# Patient Record
Sex: Male | Born: 1970 | Race: White | Hispanic: No | Marital: Married | Smoking: Never smoker
Health system: Southern US, Community
[De-identification: ages and names within clinical notes are randomized; demographics above are authoritative.]

## PROBLEM LIST (undated history)

## (undated) DIAGNOSIS — IMO0001 Reserved for inherently not codable concepts without codable children: Secondary | ICD-10-CM

## (undated) DIAGNOSIS — G4730 Sleep apnea, unspecified: Secondary | ICD-10-CM

## (undated) DIAGNOSIS — K219 Gastro-esophageal reflux disease without esophagitis: Secondary | ICD-10-CM

## (undated) HISTORY — PX: EYE SURGERY: SHX253

## (undated) HISTORY — PX: HERNIA REPAIR: SHX51

## (undated) HISTORY — PX: FRACTURE SURGERY: SHX138

## (undated) HISTORY — PX: ELBOW SURGERY: SHX618

## (undated) HISTORY — PX: OTHER SURGICAL HISTORY: SHX169

---

## 2001-09-30 ENCOUNTER — Ambulatory Visit (HOSPITAL_BASED_OUTPATIENT_CLINIC_OR_DEPARTMENT_OTHER): Admission: RE | Admit: 2001-09-30 | Discharge: 2001-09-30 | Payer: Self-pay | Admitting: Orthopedic Surgery

## 2008-08-23 ENCOUNTER — Ambulatory Visit (HOSPITAL_COMMUNITY): Admission: RE | Admit: 2008-08-23 | Discharge: 2008-08-23 | Payer: Self-pay | Admitting: General Surgery

## 2010-04-21 LAB — DIFFERENTIAL
Basophils Absolute: 0 10*3/uL (ref 0.0–0.1)
Basophils Relative: 0 % (ref 0–1)
Eosinophils Absolute: 0.1 10*3/uL (ref 0.0–0.7)
Eosinophils Relative: 2 % (ref 0–5)
Lymphocytes Relative: 20 % (ref 12–46)
Lymphs Abs: 1.6 10*3/uL (ref 0.7–4.0)
Monocytes Absolute: 0.9 10*3/uL (ref 0.1–1.0)
Monocytes Relative: 11 % (ref 3–12)
Neutro Abs: 5.3 10*3/uL (ref 1.7–7.7)
Neutrophils Relative %: 67 % (ref 43–77)

## 2010-04-21 LAB — URINALYSIS, ROUTINE W REFLEX MICROSCOPIC
Bilirubin Urine: NEGATIVE
Glucose, UA: NEGATIVE mg/dL
Hgb urine dipstick: NEGATIVE
Ketones, ur: NEGATIVE mg/dL
Nitrite: NEGATIVE
Protein, ur: NEGATIVE mg/dL
Specific Gravity, Urine: 1.024 (ref 1.005–1.030)
Urobilinogen, UA: 0.2 mg/dL (ref 0.0–1.0)
pH: 5 (ref 5.0–8.0)

## 2010-04-21 LAB — CBC
HCT: 44.7 % (ref 39.0–52.0)
Hemoglobin: 15.1 g/dL (ref 13.0–17.0)
MCHC: 33.7 g/dL (ref 30.0–36.0)
MCV: 93.1 fL (ref 78.0–100.0)
Platelets: 287 10*3/uL (ref 150–400)
RBC: 4.8 MIL/uL (ref 4.22–5.81)
RDW: 13 % (ref 11.5–15.5)
WBC: 7.9 10*3/uL (ref 4.0–10.5)

## 2010-05-29 NOTE — Op Note (Signed)
NAMETERRACE, FONTANILLA NO.:  [id number]   MEDICAL RECORD NO.:  [id number]          PATIENT TYPE:  AMB   LOCATION:  DAY                          FACILITY:  [facility]   PHYSICIAN:  Almond Lint, MD       DATE OF BIRTH:  Dec 13, 1970   DATE OF PROCEDURE:  08/23/2008  DATE OF DISCHARGE:                               OPERATIVE REPORT   PREOPERATIVE DIAGNOSES:  Right inguinal hernia and hemorrhoids.   POSTOPERATIVE DIAGNOSIS:  Right inguinal hernia and hemorrhoids.   PROCEDURE PERFORMED:  Right inguinal herniorrhaphy with mesh,  examination under anesthesia, with the internal hemorrhoid banding x2.   SURGEON:  Almond Lint, M.D.   ASSISTANT:  None.   ANESTHESIA:  General and local.   FINDINGS:  Indirect inguinal hernia and prolapsing internal hemorrhoids.   SPECIMEN:  None.   ESTIMATED BLOOD LOSS:  15 mL.   COMPLICATIONS:  None noted.   PROCEDURE:  Mr. Sobol was identified in the holding area and taken to the  operating room, where he was placed supine on the operating room table.  General endotracheal anesthesia was induced.  He was placed into  lithotomy stirrups and these were placed to the midline and as far  posterior as would ago.  His groin was clipped, prepped and draped in a  sterile fashion.  Time-out was performed according to the surgical  safety check list.  When all was correct we continued.  The anterior-  superior iliac spine was identified, as well as the pubis.  A 5-cm  incision was anesthetized and created with a #15 blade slightly superior  so the inguinal ligament.  Subcutaneous tissues were divided with the  Bovie cautery.  The external oblique fascia was identified and opened  with the scalpel.  Metzenbaum scissors were used to elevate the fascia  and then to extend the fascial incision to the external inguinal ring  inferomedially and then posterolaterally.  The Kittner was used to  dissect the external oblique from the underlying fascia.   Similarly the  external oblique was elevated with a hemostat and a Kittner was used to  clean out the inferior shelving edge.  The spermatic cord was then  elevated and a Penrose drain was passed around it.  He was noted to have  very prominent cremasteric fibers.  These were dissected free and the  hernia sac was identified and elevated with a hemostat.  This was  dissected completely from the spermatic cord and then opened up.  There  were no intra-abdominal contents in the hernia sac.   The hernia sac was closed under direct visualization with a 2-0 silk  pursestring suture.  This was then dunked back into the abdomen.  Care  was taken with the suture to make sure that the vas deferens or the  spermatic vessels were not incorporated into the pursestring.  The  ilioinguinal nerve was passing directly through the area to be sutured  and so this was cut.  The cremasteric fibers were stripped down further  off the cord posteriorly in order to  clean off the insertion of the cord  entering the internal ring.  A piece of 3 x 6 polypropylene mesh was cut  to the appropriate size and secured to the pubic tubercle with a 2-0  Prolene.  The inferior shelving was run with the mesh overlapping it a  bit.  This was run past the internal ring.  The tails were cut on the  mesh and then passed around the spermatic cord.  The superior shelving  edge was also run with a 2-0 Prolene.  A running suture was placed for  the first third of the incision and then interrupted 2-0 Prolenes were  used to make sure the mesh did not bunch.  This tail was also tucked  under the external oblique.  A 2-0 Prolene was used to secure the tails  to each other, making sure that this was not too tight.  The external  oblique was reapproximated with 2-0 Vicryl.  Scarpa fascia was  reapproximated with a running 3-0 Vicryl and then deep dermals were  placed with 3-0 Vicryl.  The skin was closed using 4-0 running Monocryl   subcuticular.  The skin was cleaned, dried, and dressed with Dermabond.   The patient was then placed into lithotomy position and the scrotum was  taped out of the way.  The perineum was prepped and draped in sterile  fashion.  The hemorrhoids were immediately seen to be prolapsing.  An  exam under anesthesia was performed digitally, as well as with the  anoscope.  There were no masses palpated.  The right anterior and left  posterior aspects were the locations of the 2 large prolapsing internal  hemorrhoids.  These were banded.  This banding was performed after  injection with Marcaine mixed was Wydase.  The anus was then dressed  with Gelfoam with dibucaine ointment.  The patient was placed into mesh  underwear with gauze dressing on the perineum.  The patient was awakened  from anesthesia and taken to the PACU in stable condition.      Almond Lint, MD  Electronically Signed     FB/MEDQ  D:  08/23/2008  T:  08/23/2008  Job:  [id number]

## 2010-06-01 NOTE — Op Note (Signed)
NAME:  John Pacheco, John Pacheco                             ACCOUNT NO.:  [id number]   MEDICAL RECORD NO.:  [id number]                   PATIENT TYPE:  AMB   LOCATION:  DSC                                  FACILITY:  [facility]   PHYSICIAN:  Harvie Junior, M.D.                DATE OF BIRTH:  Jun 11, 1970   DATE OF PROCEDURE:  09/30/2001  DATE OF DISCHARGE:                                 OPERATIVE REPORT   PREOPERATIVE DIAGNOSIS:  Ulnar nerve neuritis, left elbow.   POSTOPERATIVE DIAGNOSIS:  Ulnar nerve neuritis, left elbow.   PROCEDURE:  Left ulnar nerve decompression.   SURGEON:  Harvie Junior, M.D.   ASSISTANT:  Currie Paris. Thedore Mins.   ANESTHESIA:  General.   BRIEF HISTORY:  He is a 40 year old male with a long history of having  significant injury to his elbow where he began having ulnar nerve-type  symptoms.  He ultimately underwent EMG, which was normal.  He continued to  have complaints of compression and pain around the elbow, positive Phalen's  at the elbow, radiating pain down the little and ring finger.  We treated  him conservatively with night splinting, anti-inflammatory medications,  steroid injection.  None of this seemed to resolve his pain.  Ultimately  because of continued complaints of pain, a repeat EMG was done, which was  again normal.  We talked about treatment options, including observation  versus ulnar decompression.  Ultimately the patient wanted to undergo ulnar  decompression, and he was brought to the operating room for this procedure.   DESCRIPTION OF PROCEDURE:  The patient was brought to the operating room and  after adequate anesthesia was obtained with a general anesthetic, the  patient was placed supine upon the operating table.  The left arm was then  prepped and draped in the usual sterile fashion.  Following this, a small  curved incision was made over the elbow.  The subcutaneous tissue was taken  down to the level of the ulnar nerve.  The ulnar nerve  was clearly  identified and in a fascial band.  The fascia was opened.  The ulnar nerve  was clearly identified.  A Glorious Peach was used to make sure it was not adherent,  and the fascia was opened both proximally and distally.  Care was then taken  with a headlight and Sewell retractors to be able to evaluate the nerve  proximally.  It was decompressed approximately 10 cm proximal to the elbow  crease.  Care was then taken to come down to the elbow and distal to the  elbow, the fascia over the flexor carpi ulnaris was identified and divided.  The two heads of the flexor carpi ulnaris muscle were identified down to the  ulnar nerve.  The ulnar nerve was identified to 10 cm distal to the elbow,  and it was freed up over that entire  distance.  Irrigation was used on the  nerve at that point, and the nerve was tested with a Therapist, nutritional to make  sure that it was, in fact, loose on both sides over that distance of 20 cm.  At this point the wound was copiously irrigated and suctioned dry.  The skin  was closed with a combination of 3-0 Vicryl and 3-0 Maxon  running suture, Benzoin and Steri-Strips were applied, and a sterile  compressive dressing was applied as well as a long-arm plaster splint, and  the patient taken to recovery, where he was noted to be in satisfactory  condition.  Estimated blood loss for the procedure was none.                                               Harvie Junior, M.D.    Ranae Plumber  D:  09/30/2001  T:  10/01/2001  Job:  [id number]

## 2011-07-22 ENCOUNTER — Ambulatory Visit (INDEPENDENT_AMBULATORY_CARE_PROVIDER_SITE_OTHER): Payer: PRIVATE HEALTH INSURANCE | Admitting: Family Medicine

## 2011-07-22 VITALS — BP 110/66 | HR 63 | Temp 98.5°F | Resp 16 | Ht 67.25 in | Wt 135.8 lb

## 2011-07-22 DIAGNOSIS — F411 Generalized anxiety disorder: Secondary | ICD-10-CM

## 2011-07-22 DIAGNOSIS — F419 Anxiety disorder, unspecified: Secondary | ICD-10-CM

## 2011-07-22 MED ORDER — ALPRAZOLAM 0.5 MG PO TBDP: ORAL_TABLET | ORAL | Status: DC

## 2011-07-22 NOTE — Progress Notes (Signed)
[facility]:  Office Visit  Chief Complaint:  Chief Complaint  Patient presents with  . Medication Refill    need alprazolam    HPI: John Pacheco is a 41 y.o. male who complains of: Here for medication refill. Is in the midst of changing PCP and is out of Xanax. He normally sees Dr. Doristine Counter but is trying to get to a new PCP at [facility] office on [address] which is closer to his home. Appt is this Friday. He also sees Chief Operating Officer for therapy. He is trying to get new PCP to help him taper off Xanax but does not want to do it cold [place]. He was rx Xanax 3 years ago for situational stress but has been on it ever sicne and would like to get off of it and needs to know how to do it without having withdrawal sxs.   Past Medical History  Diagnosis Date  . Anxiety    Past Surgical History  Procedure Date  . Eye surgery   . Right leg fracture s/p repair   . Elbow surgery   . Hernia repair   . Feet surgery     flat feet   History   Social History  . Marital Status: Married    Spouse Name: N/A    Number of Children: N/A  . Years of Education: N/A   Social History Main Topics  . Smoking status: Never Smoker   . Smokeless tobacco: None  . Alcohol Use: None  . Drug Use: None  . Sexually Active: None   Other Topics Concern  . None   Social History Narrative  . None   Family History  Problem Relation Age of Onset  . Mental illness Mother    No Known Allergies Prior to Admission medications   Medication Sig Start Date End Date Taking? Authorizing Provider  ALPRAZolam Prudy Feeler) 0.5 MG tablet Take 0.5 mg by mouth at bedtime as needed.   Yes Historical Provider, MD  ibuprofen (ADVIL,MOTRIN) 100 MG tablet Take 100 mg by mouth every 6 (six) hours as needed.   Yes Historical Provider, MD     ROS: The patient denies fevers, chills, night sweats, unintentional weight loss, chest pain, palpitations, wheezing, dyspnea on exertion, nausea, vomiting, abdominal  pain, dysuria, hematuria, melena, numbness, weakness, or tingling.  All other systems have been reviewed and were otherwise negative with the exception of those mentioned in the HPI and as above.    PHYSICAL EXAM: Filed Vitals:   07/22/11 1720  BP: 110/66  Pulse: 63  Temp: 98.5 F (36.9 C)  Resp: 16   Filed Vitals:   07/22/11 1720  Height: 5' 7.25" (1.708 m)  Weight: 135 lb 12.8 oz (61.598 kg)   Body mass index is 21.11 kg/(m^2).  General: Alert, Mildly Anxious.  HEENT:  Normocephalic, atraumatic, oropharynx patent. EOMI. Cardiovascular:  Regular rate and rhythm, no rubs murmurs or gallops.  No Carotid bruits, radial pulse intact. No pedal edema.  Respiratory: Clear to auscultation bilaterally.  No wheezes, rales, or rhonchi.  No cyanosis, no use of accessory musculature GI: No organomegaly, abdomen is soft and non-tender, positive bowel sounds.  No masses. Skin: No rashes. Neurologic: Facial musculature symmetric. Psychiatric: Patient is appropriate throughout our interaction. Lymphatic: No cervical lymphadenopathy Musculoskeletal: Gait intact.   LABS: Results for orders placed during the hospital encounter of 08/23/08  URINALYSIS, ROUTINE W REFLEX MICROSCOPIC      Component Value Range   Color, Urine YELLOW  YELLOW   APPearance CLEAR  CLEAR   Specific Gravity, Urine 1.024  1.005 - 1.030   pH 5.0  5.0 - 8.0   Glucose, UA NEGATIVE  NEGATIVE mg/dL   Hgb urine dipstick NEGATIVE  NEGATIVE   Bilirubin Urine NEGATIVE  NEGATIVE   Ketones, ur NEGATIVE  NEGATIVE mg/dL   Protein, ur NEGATIVE  NEGATIVE mg/dL   Urobilinogen, UA 0.2  0.0 - 1.0 mg/dL   Nitrite NEGATIVE  NEGATIVE   Leukocytes, UA    NEGATIVE   Value: NEGATIVE MICROSCOPIC NOT DONE ON URINES WITH NEGATIVE PROTEIN, BLOOD, LEUKOCYTES, NITRITE, OR GLUCOSE <1000 mg/dL.  CBC      Component Value Range   WBC 7.9  4.0 - 10.5 K/uL   RBC 4.80  4.22 - 5.81 MIL/uL   Hemoglobin 15.1  13.0 - 17.0 g/dL   HCT 96.0  45.4 - 09.8  %   MCV 93.1  78.0 - 100.0 fL   MCHC 33.7  30.0 - 36.0 g/dL   RDW 11.9  14.7 - 82.9 %   Platelets 287  150 - 400 K/uL  DIFFERENTIAL      Component Value Range   Neutrophils Relative 67  43 - 77 %   Neutro Abs 5.3  1.7 - 7.7 K/uL   Lymphocytes Relative 20  12 - 46 %   Lymphs Abs 1.6  0.7 - 4.0 K/uL   Monocytes Relative 11  3 - 12 %   Monocytes Absolute 0.9  0.1 - 1.0 K/uL   Eosinophils Relative 2  0 - 5 %   Eosinophils Absolute 0.1  0.0 - 0.7 K/uL   Basophils Relative 0  0 - 1 %   Basophils Absolute 0.0  0.0 - 0.1 K/uL     EKG/XRAY:   Primary read interpreted by Dr. Conley Rolls at [facility].   ASSESSMENT/PLAN: Encounter Diagnosis  Name Primary?  Marland Kitchen Anxiety Yes   Gave rx for Xanax 0.5 mg #20. No refills. Patient knows that he will not get refills from our office.  He needs to establish care with his new PCP Dr. Belinda Block in order to get Xanax refilled.     Alicja Everitt PHUONG, DO 07/22/2011 6:12 PM

## 2012-05-22 ENCOUNTER — Emergency Department (HOSPITAL_BASED_OUTPATIENT_CLINIC_OR_DEPARTMENT_OTHER): Payer: Managed Care, Other (non HMO)

## 2012-05-22 ENCOUNTER — Emergency Department (HOSPITAL_BASED_OUTPATIENT_CLINIC_OR_DEPARTMENT_OTHER)
Admission: EM | Admit: 2012-05-22 | Discharge: 2012-05-22 | Disposition: A | Discharge status: Home / Self Care | Payer: Managed Care, Other (non HMO) | Attending: Emergency Medicine | Admitting: Emergency Medicine

## 2012-05-22 ENCOUNTER — Encounter (HOSPITAL_BASED_OUTPATIENT_CLINIC_OR_DEPARTMENT_OTHER): Payer: Self-pay | Admitting: *Deleted

## 2012-05-22 DIAGNOSIS — F411 Generalized anxiety disorder: Secondary | ICD-10-CM | POA: Insufficient documentation

## 2012-05-22 DIAGNOSIS — M25469 Effusion, unspecified knee: Secondary | ICD-10-CM | POA: Insufficient documentation

## 2012-05-22 DIAGNOSIS — Z8719 Personal history of other diseases of the digestive system: Secondary | ICD-10-CM | POA: Insufficient documentation

## 2012-05-22 DIAGNOSIS — M25462 Effusion, left knee: Secondary | ICD-10-CM

## 2012-05-22 HISTORY — DX: Reserved for inherently not codable concepts without codable children: IMO0001

## 2012-05-22 HISTORY — DX: Gastro-esophageal reflux disease without esophagitis: K21.9

## 2012-05-22 MED ORDER — IBUPROFEN 800 MG PO TABS: 800.0000 mg | ORAL_TABLET | Freq: Three times a day (TID) | ORAL | Status: DC

## 2012-05-22 NOTE — ED Provider Notes (Signed)
History     CSN: [id number]  Arrival date & time 05/22/12  4098   First MD Initiated Contact with Patient 05/22/12 1833      Chief Complaint  Patient presents with  . Knee Pain    left    (Consider location/radiation/quality/duration/timing/severity/associated sxs/prior treatment) Patient is a 42 y.o. male presenting with knee pain. The history is provided by the patient. No language interpreter was used.  Knee Pain Location:  Knee Time since incident:  7 days Knee location:  L knee Pain details:    Quality:  Aching   Radiates to:  Does not radiate   Severity:  Moderate   Onset quality:  Sudden   Duration:  7 days   Timing:  Constant Chronicity:  New Foreign body present:  No foreign bodies Pt ran a 5 k a week ago and has pain in his knee since.  Past Medical History  Diagnosis Date  . Anxiety   . Reflux     Past Surgical History  Procedure Laterality Date  . Eye surgery    . Right leg fracture s/p repair    . Elbow surgery    . Hernia repair    . Feet surgery      flat feet    Family History  Problem Relation Age of Onset  . Mental illness Mother     History  Substance Use Topics  . Smoking status: Never Smoker   . Smokeless tobacco: Never Used  . Alcohol Use: No      Review of Systems  Musculoskeletal: Positive for joint swelling.  All other systems reviewed and are negative.    Allergies  Review of patient's allergies indicates no known allergies.  Home Medications   Current Outpatient Rx  Name  Route  Sig  Dispense  Refill  . ALPRAZolam (NIRAVAM) 0.5 MG dissolvable tablet      Take 1/2 tab PO in AM with breakfast and 1/2 tab PO in afternoon PRN and 1 tab PO qhs PRN   20 tablet   0   . ibuprofen (ADVIL,MOTRIN) 100 MG tablet   Oral   Take 100 mg by mouth every 6 (six) hours as needed.           BP 108/67  Temp(Src) 98 F (36.7 C) (Oral)  Resp 20  Ht 5\' 7"  (1.702 m)  Wt 140 lb (63.504 kg)  BMI 21.92 kg/m2  SpO2  100%  Physical Exam  Vitals reviewed. Constitutional: He appears well-developed and well-nourished.  HENT:  Head: Normocephalic.  Musculoskeletal: He exhibits tenderness.  Tender left knee,  Trace effusion,  No instability  Neurological: He is alert.  Skin: Skin is warm.  Psychiatric: He has a normal mood and affect.    ED Course  Procedures (including critical care time)  Labs Reviewed - No data to display Dg Knee Complete 4 Views Left  05/22/2012  *RADIOLOGY REPORT*  Clinical Data: Left knee pain  LEFT KNEE - COMPLETE 4+ VIEW  Comparison: None.  Findings: Small suprapatellar effusion.  No fracture or dislocation.  Soft tissues are unremarkable.  No radiopaque foreign body.  IMPRESSION: Small suprapatellar effusion.  No acute osseous finding.   Original Report Authenticated By: Christiana Pellant, M.D.      1. Knee effusion, left       MDM  Knee imbolizer,  Ibuprofen,  Follow up with Dr. Pearletha Forge for recheck next week.        Lonia Skinner Patriot, PA-C 05/22/12 2024  Lonia Skinner Holt, PA-C 05/22/12 2026

## 2012-05-22 NOTE — ED Notes (Signed)
Patient states he ran a 5 K last Saturday and during the race, his left knee started to hurt.  Has continued to knee pain, which has progressively worsened.  No known injury.

## 2012-05-22 NOTE — ED Notes (Signed)
Patient transported to X-ray 

## 2012-05-23 NOTE — ED Provider Notes (Signed)
Medical screening examination/treatment/procedure(s) were performed by non-physician practitioner and as supervising physician I was immediately available for consultation/collaboration.    Gilda Crease, MD 05/23/12 1455

## 2012-05-27 ENCOUNTER — Encounter: Payer: Self-pay | Admitting: Family Medicine

## 2012-05-27 ENCOUNTER — Ambulatory Visit: Payer: Managed Care, Other (non HMO) | Admitting: Family Medicine

## 2012-05-27 ENCOUNTER — Ambulatory Visit (INDEPENDENT_AMBULATORY_CARE_PROVIDER_SITE_OTHER): Payer: Managed Care, Other (non HMO) | Admitting: Family Medicine

## 2012-05-27 VITALS — BP 118/76 | HR 80 | Ht 68.0 in | Wt 140.0 lb

## 2012-05-27 DIAGNOSIS — M25569 Pain in unspecified knee: Secondary | ICD-10-CM

## 2012-05-27 DIAGNOSIS — M25562 Pain in left knee: Secondary | ICD-10-CM

## 2012-05-27 NOTE — Patient Instructions (Addendum)
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 You do not have any arthritis or bony abnormalities on your x-rays. Start with quad and hamstring strengthening exercises - each 3 sets of 10 once a day. Avoid deep squats, deep lunges, stairmaster, leg press, running (for now), and plyometrics. Ice knee 15 minutes at a time 3-4 times a day. Aleve 2 tabs twice a day with food for pain and inflammation for 1 week then as needed. Stop using the immobilizer unless you absolutely need this. Knee braces usually aren't helpful for this condition. Work restrictions as noted. Follow up with me in 4-6 weeks for reevaluation.

## 2012-05-31 ENCOUNTER — Encounter: Payer: Self-pay | Admitting: Family Medicine

## 2012-05-31 NOTE — Progress Notes (Signed)
  Subjective:    Patient ID: John Pacheco, male    DOB: 08-19-1970, 42 y.o.   MRN: [id number]  PCP: Sedalia Muta  HPI 42 yo M here for left knee pain.  Patient reports he typically goes to the gym once or twice a week. He did a walk:jog program for a couple weeks and started developing medial left knee pain. This intensified over 3 days and also developed swelling. Tried ibuprofen, icing. No catching, locking, giving out. No prior issues with his knee.  Past Medical History  Diagnosis Date  . Anxiety   . Reflux     Current Outpatient Prescriptions on File Prior to Visit  Medication Sig Dispense Refill  . ALPRAZolam (NIRAVAM) 0.5 MG dissolvable tablet Take 1/2 tab PO in AM with breakfast and 1/2 tab PO in afternoon PRN and 1 tab PO qhs PRN  20 tablet  0   No current facility-administered medications on file prior to visit.    Past Surgical History  Procedure Laterality Date  . Eye surgery    . Right leg fracture s/p repair    . Elbow surgery    . Hernia repair    . Feet surgery      flat feet    No Known Allergies  History   Social History  . Marital Status: Married    Spouse Name: N/A    Number of Children: N/A  . Years of Education: N/A   Occupational History  . Not on file.   Social History Main Topics  . Smoking status: Never Smoker   . Smokeless tobacco: Never Used  . Alcohol Use: No  . Drug Use: No  . Sexually Active: Not on file   Other Topics Concern  . Not on file   Social History Narrative  . No narrative on file    Family History  Problem Relation Age of Onset  . Mental illness Mother     BP 118/76  Pulse 80  Ht 5\' 8"  (1.727 m)  Wt 140 lb (63.504 kg)  BMI 21.29 kg/m2  Review of Systems See HPI above.    Objective:   Physical Exam Gen: NAD  L knee: No gross deformity, ecchymoses.  Minimal effusion - confirmed on u/s. Medial joint line TTP.  No lateral joint line, post patellar facet TTP. FROM. Negative ant/post drawers.  Negative valgus/varus testing. Negative lachmanns. Mild pain medially with apleys and mcmurrays.  Negative sit home, thessalys. Negative apprehension. NV intact distally.  R knee: FROM without instability, pain.     Assessment & Plan:  1. Left knee pain - radiographs negative for arthritis.  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  Will start with conservative care - home exercise program, icing, nsaids regularly.  Discontinue immobilizer.  Avoid jumping, increasing running mileage, deep squats, lunges for now.  F/u in 4-6 weeks.  See letter for work restrictions.

## 2012-06-01 DIAGNOSIS — M25562 Pain in left knee: Secondary | ICD-10-CM | POA: Insufficient documentation

## 2012-06-01 NOTE — Assessment & Plan Note (Signed)
radiographs negative for arthritis.  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  Will start with conservative care - home exercise program, icing, nsaids regularly.  Discontinue immobilizer.  Avoid jumping, increasing running mileage, deep squats, lunges for now.  F/u in 4-6 weeks.  See letter for work restrictions.

## 2012-07-01 ENCOUNTER — Ambulatory Visit: Payer: Managed Care, Other (non HMO) | Admitting: Family Medicine

## 2012-07-08 ENCOUNTER — Encounter: Payer: Self-pay | Admitting: Family Medicine

## 2012-07-08 ENCOUNTER — Ambulatory Visit (INDEPENDENT_AMBULATORY_CARE_PROVIDER_SITE_OTHER): Payer: Self-pay | Admitting: Family Medicine

## 2012-07-08 VITALS — BP 106/69 | HR 84 | Ht 68.0 in | Wt 135.0 lb

## 2012-07-08 DIAGNOSIS — M25569 Pain in unspecified knee: Secondary | ICD-10-CM

## 2012-07-08 DIAGNOSIS — M25562 Pain in left knee: Secondary | ICD-10-CM

## 2012-07-09 ENCOUNTER — Encounter: Payer: Self-pay | Admitting: Family Medicine

## 2012-07-09 NOTE — Progress Notes (Signed)
  Subjective:    Patient ID: John Pacheco, male    DOB: 19-May-1970, 42 y.o.   MRN: [id number]  PCP: Sedalia Muta  HPI  42 yo M here for f/u left knee pain.  5/14: Patient reports he typically goes to the gym once or twice a week. He did a walk:jog program for a couple weeks and started developing medial left knee pain. This intensified over 3 days and also developed swelling. Tried ibuprofen, icing. No catching, locking, giving out. No prior issues with his knee.  6/25: Patient reports his left knee feels much better. Has some occasional tenderness medial aspect of this knee. No swelling. Has been icing and taking ibuprofen only as needed. Does home exercise program. No catching, locking, giving out.  Past Medical History  Diagnosis Date  . Anxiety   . Reflux     Current Outpatient Prescriptions on File Prior to Visit  Medication Sig Dispense Refill  . ALPRAZolam (NIRAVAM) 0.5 MG dissolvable tablet Take 1/2 tab PO in AM with breakfast and 1/2 tab PO in afternoon PRN and 1 tab PO qhs PRN  20 tablet  0   No current facility-administered medications on file prior to visit.    Past Surgical History  Procedure Laterality Date  . Eye surgery    . Right leg fracture s/p repair    . Elbow surgery    . Hernia repair    . Feet surgery      flat feet    No Known Allergies  History   Social History  . Marital Status: Married    Spouse Name: N/A    Number of Children: N/A  . Years of Education: N/A   Occupational History  . Not on file.   Social History Main Topics  . Smoking status: Never Smoker   . Smokeless tobacco: Never Used  . Alcohol Use: No  . Drug Use: No  . Sexually Active: Not on file   Other Topics Concern  . Not on file   Social History Narrative  . No narrative on file    Family History  Problem Relation Age of Onset  . Mental illness Mother     BP 106/69  Pulse 84  Ht 5\' 8"  (1.727 m)  Wt 135 lb (61.236 kg)  BMI 20.53  kg/m2  Review of Systems  See HPI above.    Objective:   Physical Exam  Gen: NAD  L knee: No gross deformity, ecchymoses, effusion. No medial, lateral joint line, post patellar facet TTP. FROM. Negative ant/post drawers. Negative valgus/varus testing. Negative lachmanns. No pain medially with apleys and mcmurrays. Negative apprehension. NV intact distally.    Assessment & Plan:  1. Left knee pain - radiographs negative for arthritis.  Much better since last visit with home exercise program, icing, nsaids.  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  Continue with HEP.  Return to full duty.  F/u as needed for any concerns.

## 2012-07-09 NOTE — Patient Instructions (Addendum)
Instructed to continue home exercise program for next 6 weeks and follow up as needed.

## 2012-07-09 NOTE — Assessment & Plan Note (Signed)
radiographs negative for arthritis.  Much better since last visit with home exercise program, icing, nsaids.  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  Continue with HEP.  Return to full duty.  F/u as needed for any concerns.

## 2013-03-13 ENCOUNTER — Ambulatory Visit (INDEPENDENT_AMBULATORY_CARE_PROVIDER_SITE_OTHER): Payer: Managed Care, Other (non HMO) | Admitting: Physician Assistant

## 2013-03-13 VITALS — BP 98/60 | HR 69 | Temp 98.2°F | Resp 16 | Ht 67.5 in | Wt 156.8 lb

## 2013-03-13 DIAGNOSIS — Z1159 Encounter for screening for other viral diseases: Secondary | ICD-10-CM

## 2013-03-13 DIAGNOSIS — R6889 Other general symptoms and signs: Secondary | ICD-10-CM

## 2013-03-13 DIAGNOSIS — R899 Unspecified abnormal finding in specimens from other organs, systems and tissues: Secondary | ICD-10-CM

## 2013-03-13 DIAGNOSIS — Z114 Encounter for screening for human immunodeficiency virus [HIV]: Secondary | ICD-10-CM

## 2013-03-13 NOTE — Progress Notes (Signed)
   Subjective:    Patient ID: John Pacheco, male    DOB: 05/10/1970, 43 y.o.   MRN: [id number]  HPI Has [facility] don[facility] for about a year and he tried to donate again - he got a letter from [facility] that he had a screening positive for HIV but in the letter it appeared that they also checked a western blot which was neg and a Elisa which was also neg.  He is concerned about these results and would like the test repeated. He was told that his results would ne reported and he is concerned about that.  Pt states that he feels fine.  Married for 5 years - both had neg STD testing after marriage - Never had sex with men. No h/o IV drug use.  Review of Systems     Objective:   Physical Exam  Vitals reviewed. Constitutional: He is oriented to person, place, and time. He appears well-developed and well-nourished.  HENT:  Head: Normocephalic and atraumatic.  Right Ear: External ear normal.  Left Ear: External ear normal.  Eyes: Conjunctivae are normal.  Pulmonary/Chest: Effort normal.  Neurological: He is alert and oriented to person, place, and time.  Skin: Skin is warm and dry.  Psychiatric: He has a normal mood and affect. His behavior is normal. Judgment and thought content normal.       Assessment & Plan:  Screening for HIV (human immunodeficiency virus) - Plan: HIV antibody, HIV-1 RNA ultraquant reflex to gentyp+  Abnormal laboratory test - Plan: HIV antibody, HIV-1 RNA ultraquant reflex to gentyp+  Due to his HIV + screening - we will recheck it today as well as do a viral load for more information.  We will call him with the results. He should not donate plasma or blood until we have more information.  Benny LennertSarah Weber PA-C 03/13/2013 3:04 PM

## 2013-03-13 NOTE — Patient Instructions (Signed)
We are going to test your blood with a screening test and a confirmatory test at the same time.  I will call you with your lab results when they are available.

## 2013-03-14 LAB — HIV ANTIBODY (ROUTINE TESTING W REFLEX): HIV: NONREACTIVE

## 2013-03-15 LAB — HIV-1 RNA ULTRAQUANT REFLEX TO GENTYP+
HIV 1 RNA Quant: <20 copies/mL (ref <20)
HIV-1 RNA Quant, Log: <1.3 {Log} (ref <1.30)

## 2013-03-17 ENCOUNTER — Telehealth: Payer: Self-pay | Admitting: Physician Assistant

## 2013-03-17 ENCOUNTER — Encounter: Payer: Self-pay | Admitting: Physician Assistant

## 2013-03-17 NOTE — Telephone Encounter (Signed)
I called regarding patients neg lab results.  We will mail a copy to the patient.

## 2013-06-02 ENCOUNTER — Encounter: Payer: Self-pay | Admitting: Family Medicine

## 2013-06-02 ENCOUNTER — Ambulatory Visit (INDEPENDENT_AMBULATORY_CARE_PROVIDER_SITE_OTHER): Payer: Managed Care, Other (non HMO) | Admitting: Family Medicine

## 2013-06-02 VITALS — BP 116/74 | HR 71 | Ht 67.0 in | Wt 145.0 lb

## 2013-06-02 DIAGNOSIS — M25562 Pain in left knee: Secondary | ICD-10-CM

## 2013-06-02 DIAGNOSIS — M25569 Pain in unspecified knee: Secondary | ICD-10-CM

## 2013-06-02 NOTE — Patient Instructions (Signed)
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 Ibuprofen or aleve as needed for pain. Do straight leg raises, straight leg raises with foot turned outwards, lateral side raises, decline squats 3 sets of 10 once a day. Patellar strap when up and walking around. Consider nitro patches if not improving as expected. Dr. Jari Pacheco active series insoles. Avoid barefoot walking, flat shoes as much as possible. Follow up with me in 1 month - if you want custom orthotics make sure you tell John Pacheco when you schedule the appointment.

## 2013-06-04 ENCOUNTER — Encounter: Payer: Self-pay | Admitting: Family Medicine

## 2013-06-04 NOTE — Assessment & Plan Note (Signed)
prior radiographs negative for arthritis.  Current issues more consistent with patellar/quad tendinopathy.  Will start with home exercise program which was reviewed today.  Patellar strap.  Exercise as tolerated.  Better arch supports, avoid barefoot walking.  Consider custom orthotics.

## 2013-06-04 NOTE — Progress Notes (Signed)
Patient ID: John Pacheco, male    DOB: October 17, 1970, 43 y.o.   MRN: [id number]  PCP: Sedalia Muta  HPI 43 yo M here for f/u left knee pain.  5/14: Patient reports he typically goes to the gym once or twice a week. He did a walk:jog program for a couple weeks and started developing medial left knee pain. This intensified over 3 days and also developed swelling. Tried ibuprofen, icing. No catching, locking, giving out. No prior issues with his knee.  07/08/12: Patient reports his left knee feels much better. Has some occasional tenderness medial aspect of this knee. No swelling. Has been icing and taking ibuprofen only as needed. Does home exercise program. No catching, locking, giving out.  06/02/13: Patient states he has been doing well but last few weeks pain has started to come back more anterior knee this time. No new injury or trauma. No swelling. No catching, locking, giving out. Taking occasional advil and using topical essential oils.  Past Medical History  Diagnosis Date  . Anxiety   . Reflux     Current Outpatient Prescriptions on File Prior to Visit  Medication Sig Dispense Refill  . hydrOXYzine (ATARAX/VISTARIL) 25 MG tablet Take 25 mg by mouth at bedtime.       No current facility-administered medications on file prior to visit.    Past Surgical History  Procedure Laterality Date  . Eye surgery    . Right leg fracture s/p repair    . Elbow surgery    . Hernia repair    . Feet surgery      flat feet    No Known Allergies  History   Social History  . Marital Status: Married    Spouse Name: N/A    Number of Children: N/A  . Years of Education: N/A   Occupational History  . Not on file.   Social History Main Topics  . Smoking status: Never Smoker   . Smokeless tobacco: Never Used  . Alcohol Use: No  . Drug Use: No  . Sexual Activity: Not on file   Other Topics Concern  . Not on file   Social History Narrative  . No narrative on file     Family History  Problem Relation Age of Onset  . Mental illness Mother     BP 116/74  Pulse 71  Ht 5\' 7"  (1.702 m)  Wt 145 lb (65.772 kg)  BMI 22.71 kg/m2  Review of Systems See HPI above.    Objective:   Physical Exam Gen: NAD  L knee: No gross deformity, ecchymoses, effusion. No medial, lateral joint line, post patellar facet TTP.  No other TTP but points to patellar, quad tendon areas as where he gets pain. FROM. Negative ant/post drawers. Negative valgus/varus testing. Negative lachmanns. No pain medially with apleys and mcmurrays. Negative apprehension. 5/5 hip abduction strength. NV intact distally. Overpronation bilaterally.    Assessment & Plan:  1. Left knee pain - prior radiographs negative for arthritis.  Current issues more consistent with patellar/quad tendinopathy.  Will start with home exercise program which was reviewed today.  Patellar strap.  Exercise as tolerated.  Better arch supports, avoid barefoot walking.  Consider custom orthotics.

## 2014-03-24 ENCOUNTER — Ambulatory Visit (INDEPENDENT_AMBULATORY_CARE_PROVIDER_SITE_OTHER): Payer: 59 | Admitting: Family Medicine

## 2014-03-24 VITALS — BP 118/74 | HR 75 | Temp 98.0°F | Resp 18 | Ht 69.0 in | Wt 169.0 lb

## 2014-03-24 DIAGNOSIS — N410 Acute prostatitis: Secondary | ICD-10-CM

## 2014-03-24 LAB — POCT URINALYSIS DIPSTICK
Bilirubin, UA: NEGATIVE
Glucose, UA: NEGATIVE
Ketones, UA: NEGATIVE
Leukocytes, UA: NEGATIVE
Nitrite, UA: NEGATIVE
Protein, UA: 30
Spec Grav, UA: >=1.03
Urobilinogen, UA: 0.2
pH, UA: 5.5

## 2014-03-24 LAB — POCT UA - MICROSCOPIC ONLY
Casts, Ur, LPF, POC: NEGATIVE
Crystals, Ur, HPF, POC: NEGATIVE
Epithelial cells, urine per micros: NEGATIVE
Mucus, UA: NEGATIVE
Yeast, UA: NEGATIVE

## 2014-03-24 MED ORDER — CIPROFLOXACIN HCL 500 MG PO TABS: 500.0000 mg | ORAL_TABLET | Freq: Two times a day (BID) | ORAL | Status: AC

## 2014-03-24 NOTE — Patient Instructions (Signed)
Take antibiotic twice a day for 2 weeks. Sitting in a bath with warm water can help relieve pain. Otherwise, alternating ibuprofen and tylenol can also help pain. Return to see me in two weeks for recheck.  Prostatitis The prostate gland is about the size and shape of a walnut. It is located just below your bladder. It produces one of the components of semen, which is made up of sperm and the fluids that help nourish and transport it out from the testicles. Prostatitis is inflammation of the prostate gland.  There are four types of prostatitis:  Acute bacterial prostatitis. This is the least common type of prostatitis. It starts quickly and usually is associated with a bladder infection, high fever, and shaking chills. It can occur at any age.  Chronic bacterial prostatitis. This is a persistent bacterial infection in the prostate. It usually develops from repeated acute bacterial prostatitis or acute bacterial prostatitis that was not properly treated. It can occur in men of any age but is most common in middle-aged men whose prostate has begun to enlarge. The symptoms are not as severe as those in acute bacterial prostatitis. Discomfort in the part of your body that is in front of your rectum and below your scrotum (perineum), lower abdomen, or in the head of your penis (glans) may represent your primary discomfort.  Chronic prostatitis (nonbacterial). This is the most common type of prostatitis. It is inflammation of the prostate gland that is not caused by a bacterial infection. The cause is unknown and may be associated with a viral infection or autoimmune disorder.  Prostatodynia (pelvic floor disorder). This is associated with increased muscular tone in the pelvis surrounding the prostate. CAUSES The causes of bacterial prostatitis are bacterial infection. The causes of the other types of prostatitis are unknown.  SYMPTOMS  Symptoms can vary depending upon the type of prostatitis that  exists. There can also be overlap in symptoms. Possible symptoms for each type of prostatitis are listed below. Acute Bacterial Prostatitis  Painful urination.  Fever or chills.  Muscle or joint pains.  Low back pain.  Low abdominal pain.  Inability to empty bladder completely. Chronic Bacterial Prostatitis, Chronic Nonbacterial Prostatitis, and Prostatodynia  Sudden urge to urinate.  Frequent urination.  Difficulty starting urine stream.  Weak urine stream.  Discharge from the urethra.  Dribbling after urination.  Rectal pain.  Pain in the testicles, penis, or tip of the penis.  Pain in the perineum.  Problems with sexual function.  Painful ejaculation.  Bloody semen. DIAGNOSIS  In order to diagnose prostatitis, your health care provider will ask about your symptoms. One or more urine samples will be taken and tested (urinalysis). If the urinalysis result is negative for bacteria, your health care provider may use a finger to feel your prostate (digital rectal exam). This exam helps your health care provider determine if your prostate is swollen and tender. It will also produce a specimen of semen that can be analyzed. TREATMENT  Treatment for prostatitis depends on the cause. If a bacterial infection is the cause, it can be treated with antibiotic medicine. In cases of chronic bacterial prostatitis, the use of antibiotics for up to 1 month or 6 weeks may be necessary. Your health care provider may instruct you to take sitz baths to help relieve pain. A sitz bath is a bath of hot water in which your hips and buttocks are under water. This relaxes the pelvic floor muscles and often helps to relieve the  pressure on your prostate. HOME CARE INSTRUCTIONS   Take all medicines as directed by your health care provider.  Take sitz baths as directed by your health care provider. SEEK MEDICAL CARE IF:   Your symptoms get worse, not better.  You have a fever. SEEK IMMEDIATE  MEDICAL CARE IF:   You have chills.  You feel nauseous or vomit.  You feel lightheaded or faint.  You are unable to urinate.  You have blood or blood clots in your urine. MAKE SURE YOU:  Understand these instructions.  Will watch your condition.  Will get help right away if you are not doing well or get worse. Document Released: 12/29/1999 Document Revised: 01/05/2013 Document Reviewed: 07/20/2012 [facility] Patient Information 2015 [place], [place]. This information is not intended to replace advice given to you by your health care provider. Make sure you discuss any questions you have with your health care provider.

## 2014-03-24 NOTE — Progress Notes (Signed)
Subjective:    Patient ID: John Pacheco, male    DOB: 10/01/1970, 44 y.o.   MRN: [id number]  HPI  This is a 44 year old male who is presenting with sharp rectal pain and pressure x 12 hours. States he feels like he needs to have a BM but doesn't. Also is having rectal pain with urination. This has never happened to him before. States he had problems with hemorrhoids 5-6 years ago but this pain feels more internal. He is not constipated. He has bowel movements every day to every other day. Past few days has had formed stool with a green color. He denies blood in stool, N/V/D, anal discharge, fever or chills. He is sexually active with his wife. He has a 59 week old daughter at home and is stressed out with this.   Review of Systems  Constitutional: Negative for fever and chills.  Gastrointestinal: Positive for rectal pain. Negative for nausea, vomiting, abdominal pain, diarrhea and blood in stool.  Genitourinary: Positive for difficulty urinating. Negative for dysuria, discharge, penile pain and testicular pain.  Musculoskeletal: Negative for back pain.  Skin: Negative for rash.    Patient Active Problem List   Diagnosis Date Noted  . Left knee pain 06/01/2012   Prior to Admission medications   Medication Sig Start Date End Date Taking? Authorizing Provider  fenofibrate 160 MG tablet Take 160 mg by mouth daily.   Yes Historical Provider, MD  hydrOXYzine (ATARAX/VISTARIL) 25 MG tablet Take 25 mg by mouth at bedtime.   Yes Historical Provider, MD  omeprazole (PRILOSEC) 40 MG capsule Take 40 mg by mouth daily.   Yes Historical Provider, MD  Vitamin D, Ergocalciferol, (DRISDOL) 50000 UNITS CAPS capsule Take 50,000 Units by mouth every 7 (seven) days.   Yes Historical Provider, MD   No Known Allergies  Patient's social and family history were reviewed.     Objective:   Physical Exam  Constitutional: He is oriented to person, place, and time. He appears well-developed and well-nourished.  No distress.  HENT:  Head: Normocephalic and atraumatic.  Right Ear: Hearing normal.  Left Ear: Hearing normal.  Nose: Nose normal.  Eyes: Conjunctivae and lids are normal. Right eye exhibits no discharge. Left eye exhibits no discharge. No scleral icterus.  Cardiovascular: Normal rate, regular rhythm, normal heart sounds, intact distal pulses and normal pulses.   No murmur heard. Pulmonary/Chest: Effort normal and breath sounds normal. No respiratory distress. He has no wheezes. He has no rhonchi. He has no rales.  Abdominal: Soft. Normal appearance. There is no tenderness. There is no CVA tenderness.  Genitourinary: Rectal exam shows no external hemorrhoid and no internal hemorrhoid. Prostate is tender.  Anal skin tag  Musculoskeletal: Normal range of motion.  Neurological: He is alert and oriented to person, place, and time.  Skin: Skin is warm, dry and intact. No lesion and no rash noted.  Psychiatric: He has a normal mood and affect. His speech is normal and behavior is normal. Thought content normal.   BP 118/74 mmHg  Pulse 75  Temp(Src) 98 F (36.7 C) (Oral)  Resp 18  Ht  (1.753 m)  Wt 169 lb (76.658 kg)  BMI 24.95 kg/m2  SpO2 96%  Results for orders placed or performed in visit on 03/24/14  POCT UA - Microscopic Only  Result Value Ref Range   WBC, Ur, HPF, POC 0-2    RBC, urine, microscopic 0-1    Bacteria, U Microscopic 1+  Mucus, UA neg    Epithelial cells, urine per micros neg    Crystals, Ur, HPF, POC neg    Casts, Ur, LPF, POC neg    Yeast, UA neg    Sperm    POCT urinalysis dipstick  Result Value Ref Range   Color, UA yellow    Clarity, UA cloudy    Glucose, UA neg    Bilirubin, UA neg    Ketones, UA neg    Spec Grav, UA >=1.030    Blood, UA trace-intact    pH, UA 5.5    Protein, UA 30    Urobilinogen, UA 0.2    Nitrite, UA neg    Leukocytes, UA Negative        Assessment & Plan:  1. Acute prostatitis PSA pending. UA with protein,  otherwise negative. Pt likely has acute bacterial prostatitis. Will treat with cipro 500 mg BID x 2 weeks. He will return in 2 weeks for recheck. - POCT UA - Microscopic Only - POCT urinalysis dipstick - PSA - ciprofloxacin (CIPRO) 500 MG tablet; Take 1 tablet (500 mg total) by mouth 2 (two) times daily.  Dispense: 28 tablet; Refill: 0   Roswell MinersNicole V. Dyke BrackettBush, PA-C, M[facility]  03/24/2014

## 2014-03-25 LAB — PSA: PSA: 1.54 ng/mL (ref <=4.00)

## 2014-03-25 NOTE — Progress Notes (Signed)
Patient discussed with Ms. Bush. Agree with assessment and plan of care per her note.   

## 2014-03-28 ENCOUNTER — Telehealth: Payer: Self-pay | Admitting: Physician Assistant

## 2014-03-28 NOTE — Telephone Encounter (Signed)
Called pt and left voicemail telling him the lab tests were normal. Asked him to call back and let the staff know how he was doing.

## 2014-07-12 IMAGING — CR DG KNEE COMPLETE 4+V*L*
4 series · 4 of 4 positions shown · non-contrast
Comparison: None.

CLINICAL DATA: Left knee pain

LEFT KNEE - COMPLETE 4+ VIEW

[t knee ap left]
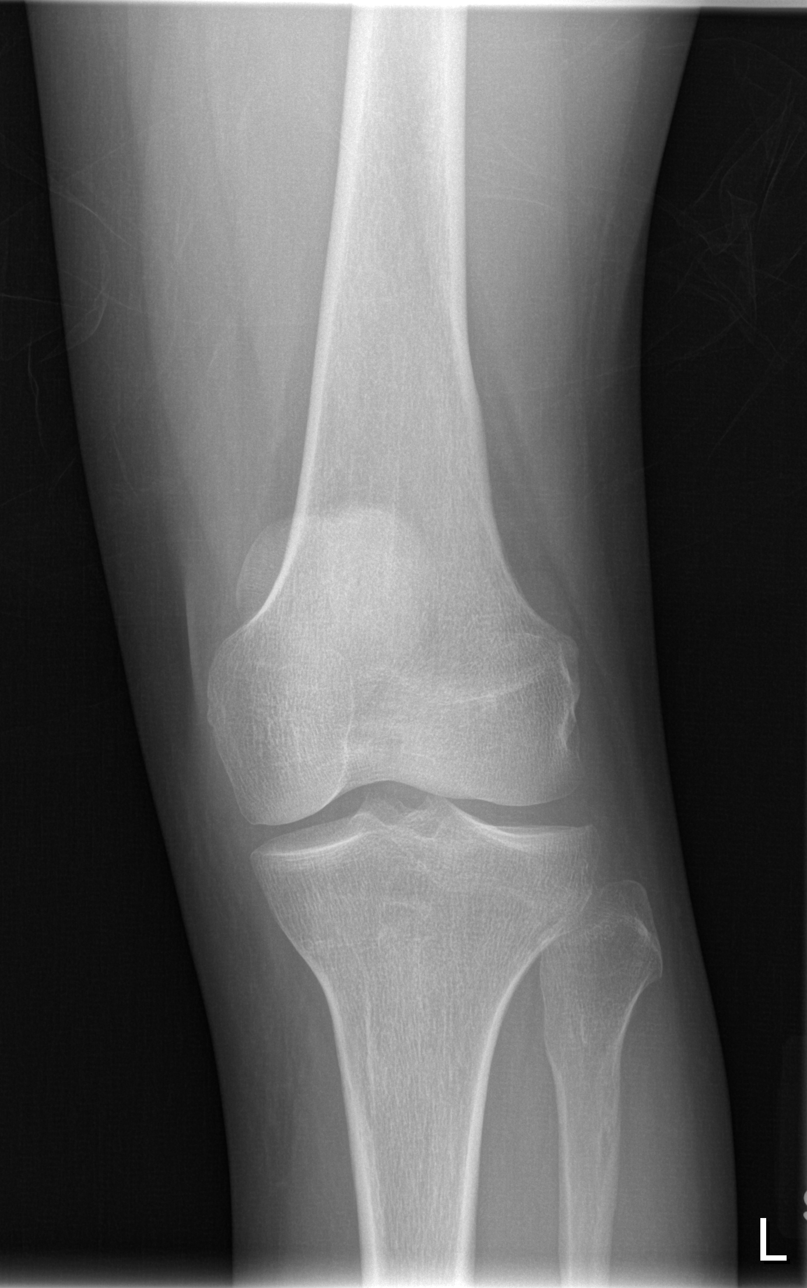

[t knee oblique left (1 of 2)]
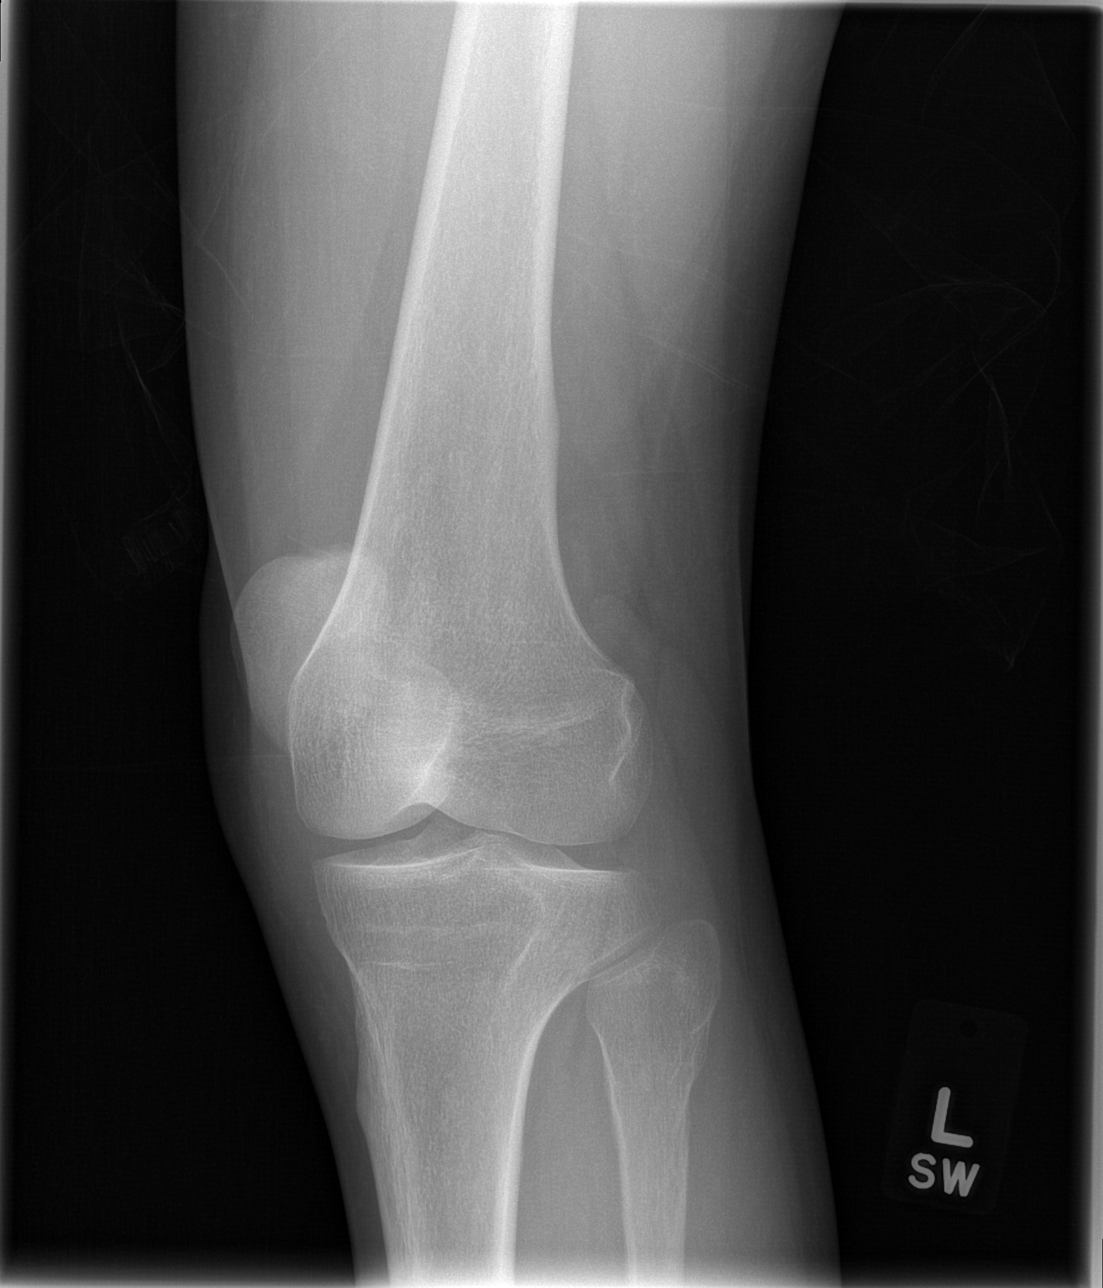

[t knee oblique left (2 of 2)]
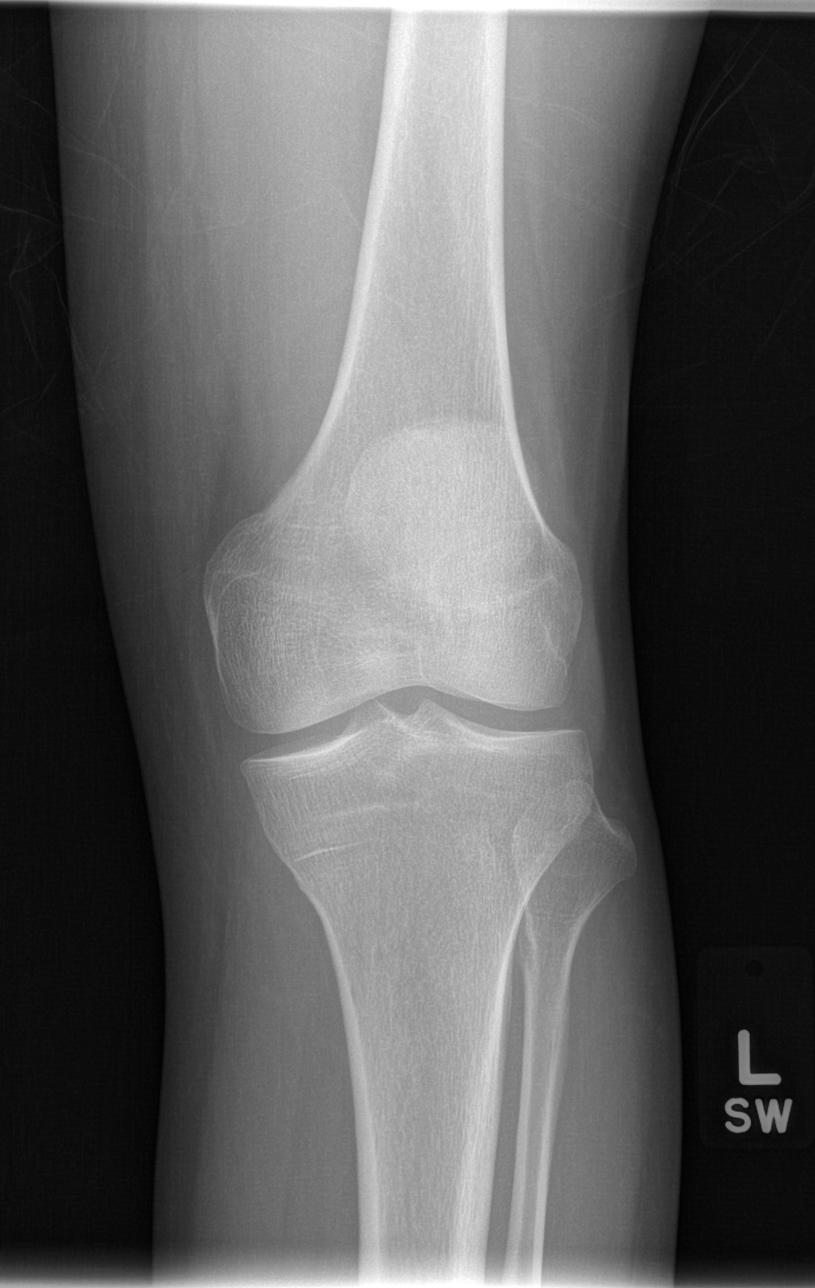

[t knee lat left]
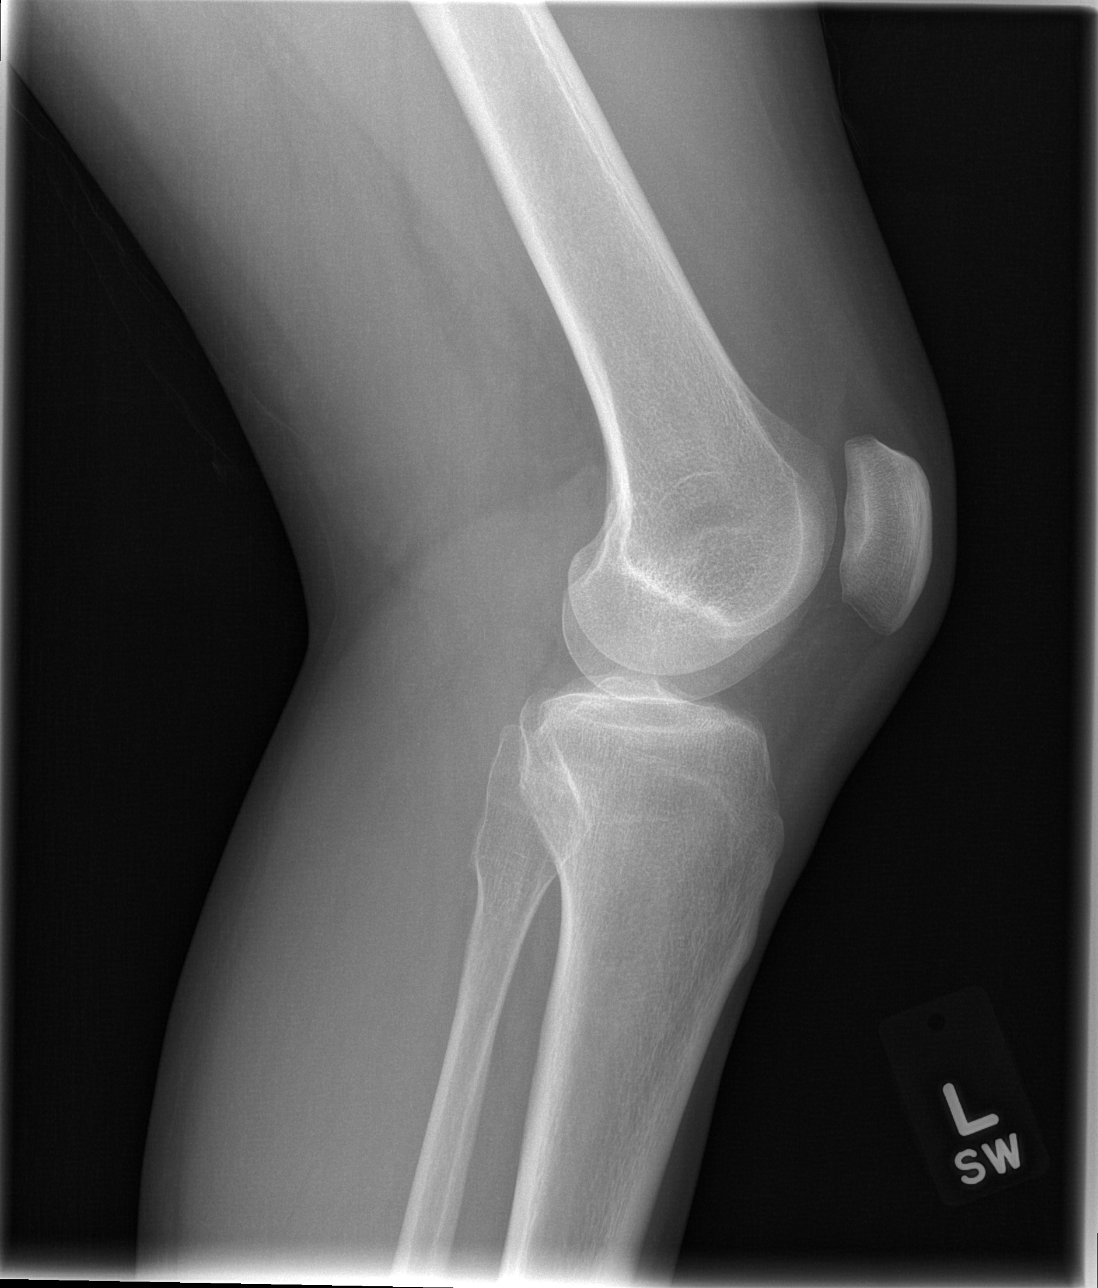

[4 of 4 positions shown; findings below may reference images not displayed]

FINDINGS: Small suprapatellar effusion.  No fracture or
dislocation.  Soft tissues are unremarkable.  No radiopaque foreign
body.
IMPRESSION: Small suprapatellar effusion.  No acute osseous finding.

## 2015-12-12 ENCOUNTER — Emergency Department (HOSPITAL_COMMUNITY)
Admission: EM | Admit: 2015-12-12 | Discharge: 2015-12-12 | Disposition: A | Discharge status: Home / Self Care | Payer: Medicaid Other | Attending: Emergency Medicine | Admitting: Emergency Medicine

## 2015-12-12 ENCOUNTER — Emergency Department (HOSPITAL_COMMUNITY): Payer: Medicaid Other

## 2015-12-12 ENCOUNTER — Encounter (HOSPITAL_COMMUNITY): Payer: Self-pay | Admitting: *Deleted

## 2015-12-12 DIAGNOSIS — M549 Dorsalgia, unspecified: Secondary | ICD-10-CM | POA: Diagnosis not present

## 2015-12-12 DIAGNOSIS — R0789 Other chest pain: Secondary | ICD-10-CM | POA: Diagnosis not present

## 2015-12-12 DIAGNOSIS — R079 Chest pain, unspecified: Secondary | ICD-10-CM | POA: Diagnosis present

## 2015-12-12 LAB — BASIC METABOLIC PANEL
Anion gap: 6 (ref 5–15)
BUN: 11 mg/dL (ref 6–20)
CO2: 27 mmol/L (ref 22–32)
Calcium: 9.8 mg/dL (ref 8.9–10.3)
Chloride: 107 mmol/L (ref 101–111)
Creatinine, Ser: 1.15 mg/dL (ref 0.61–1.24)
GFR calc Af Amer: >60 mL/min (ref >60)
GFR calc non Af Amer: >60 mL/min (ref >60)
Glucose, Bld: 108 mg/dL — ABNORMAL HIGH (ref 65–99)
Potassium: 4.3 mmol/L (ref 3.5–5.1)
Sodium: 140 mmol/L (ref 135–145)

## 2015-12-12 LAB — CBC
HCT: 46.6 % (ref 39.0–52.0)
Hemoglobin: 16.3 g/dL (ref 13.0–17.0)
MCH: 31.1 pg (ref 26.0–34.0)
MCHC: 35 g/dL (ref 30.0–36.0)
MCV: 88.9 fL (ref 78.0–100.0)
Platelets: 306 10*3/uL (ref 150–400)
RBC: 5.24 MIL/uL (ref 4.22–5.81)
RDW: 13.5 % (ref 11.5–15.5)
WBC: 8.6 10*3/uL (ref 4.0–10.5)

## 2015-12-12 LAB — I-STAT TROPONIN, ED: Troponin i, poc: 0.02 ng/mL (ref 0.00–0.08)

## 2015-12-12 MED ORDER — METHOCARBAMOL 500 MG PO TABS: 500.0000 mg | ORAL_TABLET | Freq: Four times a day (QID) | ORAL | 0 refills | Status: DC | PRN

## 2015-12-12 MED ORDER — KETOROLAC TROMETHAMINE 60 MG/2ML IM SOLN
60.0000 mg | Freq: Once | INTRAMUSCULAR | Status: AC
  Administered 2015-12-12: 60 mg via INTRAMUSCULAR
  Filled 2015-12-12: qty 2

## 2015-12-12 NOTE — Discharge Instructions (Signed)
Read the information below.  Use the prescribed medication as directed.  Please discuss all new medications with your pharmacist.  You may return to the Emergency Department at any time for worsening condition or any new symptoms that concern you.   ° °You have been diagnosed by your caregiver as having chest wall pain. °SEEK IMMEDIATE MEDICAL ATTENTION IF: °You develop a fever.  °Your chest pains become severe or intolerable.  °You develop new, unexplained symptoms (problems).  °You develop shortness of breath, nausea, vomiting, sweating or feel light headed.  °You develop a new cough or you cough up blood. °

## 2015-12-12 NOTE — ED Provider Notes (Signed)
[facility]-EMERGENCY DEPT Provider Note   CSN: [id number] Arrival date & time: 12/12/15  1503     History   Chief Complaint Chief Complaint  Patient presents with  . Chest Pain  . Back Pain    HPI John Pacheco is a 45 y.o. male.  HPI   Patient sent from [facility] for left chest and left back pain with EKG changes identified, sent for further evaluation.  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  Afterwards he developed persistent aching and soreness in this area.  The pain has been constant since yesterday, is worse with certain movements.  Denies any fevers, recent URI, cough, SOB.  Denies recent immobilization, leg swelling, history of blood clots.  Denies family hx early CAD.     Past Medical History:  Diagnosis Date  . Anxiety   . Reflux     Patient Active Problem List   Diagnosis Date Noted  . Left knee pain 06/01/2012    Past Surgical History:  Procedure Laterality Date  . ELBOW SURGERY    . EYE SURGERY    . feet surgery     flat feet  . HERNIA REPAIR    . right leg fracture s/p repair         Home Medications    Prior to Admission medications   Medication Sig Start Date End Date Taking? Authorizing Provider  cholecalciferol (VITAMIN D) 1000 units tablet Take 2,000 Units by mouth daily.   Yes Historical Provider, MD  fenofibrate 160 MG tablet Take 160 mg by mouth daily.   Yes Historical Provider, MD  hydrOXYzine (ATARAX/VISTARIL) 25 MG tablet Take 25 mg by mouth at bedtime.   Yes Historical Provider, MD  ibuprofen (ADVIL,MOTRIN) 200 MG tablet Take 400 mg by mouth every 6 (six) hours as needed for headache (or pain).   Yes Historical Provider, MD  omeprazole (PRILOSEC) 40 MG capsule Take 40 mg by mouth daily.   Yes Historical Provider, MD  Propylene Glycol (SYSTANE BALANCE) 0.6 % SOLN Place 1-2 drops into both eyes daily as needed (for  itching).   Yes Historical Provider, MD  methocarbamol (ROBAXIN) 500 MG tablet Take 1-2 tablets (500-1,000 mg total) by mouth every 6 (six) hours as needed (pain). 12/12/15   Trixie DredgeEmily Hanford Lust, PA-C    Family History Family History  Problem Relation Age of Onset  . Mental illness Mother   . Heart disease Mother   . Hyperlipidemia Mother   . Heart disease Father   . Hyperlipidemia Sister     Social History Social History  Substance Use Topics  . Smoking status: Never Smoker  . Smokeless tobacco: Never Used  . Alcohol use No     Allergies   Patient has no known allergies.   Review of Systems Review of Systems   Physical Exam Updated Vital Signs BP 125/89   Pulse 77   Temp 97.6 F (36.4 C) (Oral)   Resp 20   SpO2 98%   Physical Exam  Constitutional: He appears well-developed and well-nourished. No distress.  HENT:  Head: Normocephalic and atraumatic.  Neck: Neck supple.  Cardiovascular: Normal rate and regular rhythm.   Pulmonary/Chest: Effort normal and breath sounds normal. No respiratory distress. He has no wheezes. He has no rales.     He exhibits tenderness.    Abdominal: Soft. He exhibits no distension  and no mass. There is no tenderness. There is no rebound and no guarding.  Neurological: He is alert. He exhibits normal muscle tone.  Skin: He is not diaphoretic.  Nursing note and vitals reviewed.    ED Treatments / Results  Labs (all labs ordered are listed, but only abnormal results are displayed) Labs Reviewed  BASIC METABOLIC PANEL - Abnormal; Notable for the following:       Result Value   Glucose, Bld 108 (*)    All other components within normal limits  CBC  I-STAT TROPOININ, ED    EKG  EKG Interpretation  Date/Time:  Tuesday December 12 2015 15:13:22 EST Ventricular Rate:  76 PR Interval:  150 QRS Duration: 88 QT Interval:  372 QTC Calculation: 418 R Axis:   72 Text Interpretation:  Normal sinus rhythm Normal ECG Confirmed by  Rubin PayorPICKERING  MD, NATHAN [phone]([id number]) on 12/12/2015 6:51:32 PM       Radiology Dg Chest 2 View  Result Date: 12/12/2015 CLINICAL DATA:  Chest pain. EXAM: CHEST  2 VIEW COMPARISON:  None. FINDINGS: Normal heart size and mediastinal contours. Apparent ovoid density overlapping the lower trachea in the lateral projection is likely hilar vessels. No infiltrate or edema. No effusion or pneumothorax. No acute osseous findings. IMPRESSION: No active cardiopulmonary disease. Electronically Signed   By: Marnee SpringJonathon  Watts M.D.   On: 12/12/2015 15:32    Procedures Procedures (including critical care time)  Medications Ordered in ED Medications  ketorolac (TORADOL) injection 60 mg (not administered)     Initial Impression / Assessment and Plan / ED Course  I have reviewed the triage vital signs and the nursing notes.  Pertinent labs & imaging results that were available during my care of the patient were reviewed by me and considered in my medical decision making (see chart for details).  Clinical Course    Afebrile, nontoxic patient with left chest and left back pain that is reproducible with palpation and worse with movement.  Sent from urgent care for what they thought were concerning EKG changes.  Reviewed EKG from urgent care with Dr Rubin PayorPickering, who also saw the patient.  EKG is nonischemic, perhaps slight depression of PR interval rather than urgent care's concern for ST elevation.  Pain has been constant since yesterday.  Troponin negative.  CXR negative.  HEART score is 1 for known HLD. Toradol given in ED. Pt reassured.  D/C home with robaxin.  PCP resources for follow up.  Discussed result, findings, treatment, and follow up  with patient.  Pt given return precautions.  Pt verbalizes understanding and agrees with plan.         Final Clinical Impressions(s) / ED Diagnoses   Final diagnoses:  Chest wall pain    New Prescriptions New Prescriptions   METHOCARBAMOL (ROBAXIN) 500 MG TABLET     Take 1-2 tablets (500-1,000 mg total) by mouth every 6 (six) hours as needed (pain).     Trixie Dredgemily Annsley Akkerman, PA-C 12/12/15 1914    Benjiman CoreNathan Pickering, MD 12/12/15 [phone]

## 2015-12-12 NOTE — ED Triage Notes (Signed)
Pt reports onset of left side chest pain last night, radiates around his side and into his back. Denies sob. ekg done at triage. Pt was sent here from [facility].

## 2016-08-26 DIAGNOSIS — F411 Generalized anxiety disorder: Secondary | ICD-10-CM | POA: Diagnosis not present

## 2016-09-02 DIAGNOSIS — E784 Other hyperlipidemia: Secondary | ICD-10-CM | POA: Diagnosis not present

## 2016-09-02 DIAGNOSIS — Z125 Encounter for screening for malignant neoplasm of prostate: Secondary | ICD-10-CM | POA: Diagnosis not present

## 2016-09-02 DIAGNOSIS — Z0000 Encounter for general adult medical examination without abnormal findings: Secondary | ICD-10-CM | POA: Diagnosis not present

## 2016-09-02 DIAGNOSIS — R1013 Epigastric pain: Secondary | ICD-10-CM | POA: Diagnosis not present

## 2016-09-23 DIAGNOSIS — Z0000 Encounter for general adult medical examination without abnormal findings: Secondary | ICD-10-CM | POA: Diagnosis not present

## 2016-09-23 DIAGNOSIS — E784 Other hyperlipidemia: Secondary | ICD-10-CM | POA: Diagnosis not present

## 2016-09-23 DIAGNOSIS — Z125 Encounter for screening for malignant neoplasm of prostate: Secondary | ICD-10-CM | POA: Diagnosis not present

## 2016-10-02 ENCOUNTER — Ambulatory Visit (INDEPENDENT_AMBULATORY_CARE_PROVIDER_SITE_OTHER): Payer: BLUE CROSS/BLUE SHIELD | Admitting: Urgent Care

## 2016-10-02 ENCOUNTER — Encounter: Payer: Self-pay | Admitting: Urgent Care

## 2016-10-02 VITALS — BP 125/83 | HR 60 | Temp 98.0°F | Resp 18 | Ht 69.0 in | Wt 178.0 lb

## 2016-10-02 DIAGNOSIS — S0033XA Contusion of nose, initial encounter: Secondary | ICD-10-CM | POA: Diagnosis not present

## 2016-10-02 DIAGNOSIS — S0031XA Abrasion of nose, initial encounter: Secondary | ICD-10-CM | POA: Diagnosis not present

## 2016-10-02 DIAGNOSIS — R519 Headache, unspecified: Secondary | ICD-10-CM

## 2016-10-02 DIAGNOSIS — R51 Headache: Secondary | ICD-10-CM | POA: Diagnosis not present
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
## 2016-10-02 NOTE — Patient Instructions (Addendum)
You may take  Tylenol with ibuprofen 400-600mg  every 6 hours for pain and inflammation.    Contusion A contusion is a deep bruise. Contusions are the result of a blunt injury to tissues and muscle fibers under the skin. The injury causes bleeding under the skin. The skin overlying the contusion may turn blue, purple, or yellow. Minor injuries will give you a painless contusion, but more severe contusions may stay painful and swollen for a few weeks. What are the causes? This condition is usually caused by a blow, trauma, or direct force to an area of the body. What are the signs or symptoms? Symptoms of this condition include:  Swelling of the injured area.  Pain and tenderness in the injured area.  Discoloration. The area may have redness and then turn blue, purple, or yellow.  How is this diagnosed? This condition is diagnosed based on a physical exam and medical history. An X-ray, CT scan, or MRI may be needed to determine if there are any associated injuries, such as broken bones (fractures). How is this treated? Specific treatment for this condition depends on what area of the body was injured. In general, the best treatment for a contusion is resting, icing, applying pressure to (compression), and elevating the injured area. This is often called the RICE strategy. Over-the-counter anti-inflammatory medicines may also be recommended for pain control. Follow these instructions at home:  Rest the injured area.  If directed, apply ice to the injured area: ? Put ice in a plastic bag. ? Place a towel between your skin and the bag. ? Leave the ice on for 20 minutes, 2-3 times per day.  If directed, apply light compression to the injured area using an elastic bandage. Make sure the bandage is not wrapped too tightly. Remove and reapply the bandage as directed by your health care provider.  If possible, raise (elevate) the injured area above the level of your heart while you are  sitting or lying down.  Take over-the-counter and prescription medicines only as told by your health care provider. Contact a health care provider if:  Your symptoms do not improve after several days of treatment.  Your symptoms get worse.  You have difficulty moving the injured area. Get help right away if:  You have severe pain.  You have numbness in a hand or foot.  Your hand or foot turns pale or cold. This information is not intended to replace advice given to you by your health care provider. Make sure you discuss any questions you have with your health care provider. Document Released: 10/10/2004 Document Revised: 05/11/2015 Document Reviewed: 05/18/2014 Elsevier Interactive Patient Education  2017 [employer].       IF you received an x-ray today, you will receive an invoice from [facility]. Please contact [facility] at [phone] with questions or concerns regarding your invoice.   IF you received labwork today, you will receive an invoice from [facility]. Please contact [facility] at [phone] with questions or concerns regarding your invoice.   Our billing staff will not be able to assist you with questions regarding bills from these companies.  You will be contacted with the lab results as soon as they are available. The fastest way to get your results is to activate your My Chart account. Instructions are located on the last page of this paperwork. If you have not heard from [place] regarding the results in 2 weeks, please contact this office.

## 2016-10-07 DIAGNOSIS — G4709 Other insomnia: Secondary | ICD-10-CM | POA: Diagnosis not present

## 2016-10-08 DIAGNOSIS — F411 Generalized anxiety disorder: Secondary | ICD-10-CM | POA: Diagnosis not present

## 2016-10-10 DIAGNOSIS — G4709 Other insomnia: Secondary | ICD-10-CM | POA: Diagnosis not present

## 2016-10-16 DIAGNOSIS — G4733 Obstructive sleep apnea (adult) (pediatric): Secondary | ICD-10-CM | POA: Diagnosis not present

## 2016-11-06 DIAGNOSIS — Z23 Encounter for immunization: Secondary | ICD-10-CM | POA: Diagnosis not present

## 2016-12-06 ENCOUNTER — Ambulatory Visit: Payer: BLUE CROSS/BLUE SHIELD | Admitting: Family Medicine

## 2016-12-06 ENCOUNTER — Other Ambulatory Visit: Payer: Self-pay

## 2016-12-06 ENCOUNTER — Encounter: Payer: Self-pay | Admitting: Family Medicine

## 2016-12-06 VITALS — BP 122/78 | HR 81 | Temp 98.0°F | Wt 177.0 lb

## 2016-12-06 DIAGNOSIS — R059 Cough, unspecified: Secondary | ICD-10-CM

## 2016-12-06 DIAGNOSIS — R05 Cough: Secondary | ICD-10-CM | POA: Diagnosis not present

## 2016-12-06 DIAGNOSIS — J069 Acute upper respiratory infection, unspecified: Secondary | ICD-10-CM

## 2016-12-06 MED ORDER — GUAIFENESIN-CODEINE 100-10 MG/5ML PO SOLN: 5.0000 mL | Freq: Three times a day (TID) | ORAL | 0 refills | Status: DC | PRN

## 2016-12-06 NOTE — Progress Notes (Signed)
11/23/20185:57 PM  John Pacheco 07/21/1970, 46 y.o. male [id number]  Chief Complaint  Patient presents with  . Cough    1 week  . Sore Throat    only when coughing  . Nasal Congestion    1 day    HPI:   Patient is a 46 y.o. male who presents today for about a week of coughing worse at night with mild sore throat and nasal congestion. Everybody in the household seems to be getting sick and better and then sick again. He has been taking various OTC cold meds over the past several days without much relief. He has some minimal sneezing. No itchy/watery eyes. No SOB, no fever or chills, some mild sinus discomfort but not pain. No ear pain.   [facility] 12/06/2016 10/02/2016  Decreased Interest 0 0  Down, Depressed, Hopeless 0 0  PHQ - 2 Score 0 0    No Known Allergies  Prior to Admission medications   Medication Sig Start Date End Date Taking? Authorizing Provider  cholecalciferol (VITAMIN D) 1000 units tablet Take 2,000 Units by mouth daily.    [provider]  fenofibrate 160 MG tablet Take 160 mg by mouth daily.    [provider]  hydrOXYzine (ATARAX/VISTARIL) 25 MG tablet Take 25 mg by mouth at bedtime.    [provider]  ibuprofen (ADVIL,MOTRIN) 200 MG tablet Take 400 mg by mouth every 6 (six) hours as needed for headache (or pain).    [provider]  methocarbamol (ROBAXIN) 500 MG tablet Take 1-2 tablets (500-1,000 mg total) by mouth every 6 (six) hours as needed (pain). Patient not taking: Reported on 10/02/2016 12/12/15   Trixie DredgeWest, Emily, PA-C  omeprazole (PRILOSEC) 40 MG capsule Take 40 mg by mouth daily.    [provider]  Propylene Glycol (SYSTANE BALANCE) 0.6 % SOLN Place 1-2 drops into both eyes daily as needed (for itching).    [provider]    Past Medical History:  Diagnosis Date  . Reflux     Past Surgical History:  Procedure Laterality Date  . ELBOW SURGERY    . EYE SURGERY    . feet  surgery     flat feet  . HERNIA REPAIR    . right leg fracture s/p repair      Social History   Tobacco Use  . Smoking status: Never Smoker  . Smokeless tobacco: Never Used  Substance Use Topics  . Alcohol use: No    Family History  Problem Relation Age of Onset  . Mental illness Mother   . Heart disease Mother   . Hyperlipidemia Mother   . Heart disease Father   . Hyperlipidemia Sister     ROS Per hpi  OBJECTIVE:  Blood pressure 122/78, pulse 81, temperature 98 F (36.7 C), temperature source Oral, weight 177 lb (80.3 kg), SpO2 96 %.  Physical Exam  Constitutional: He is oriented to person, place, and time and well-developed, well-nourished, and in no distress.  HENT:  Head: Normocephalic and atraumatic.  Right Ear: Hearing, tympanic membrane, external ear and ear canal normal.  Left Ear: Hearing, tympanic membrane, external ear and ear canal normal.  Mouth/Throat: Oropharynx is clear and moist. No oropharyngeal exudate.  Eyes: Conjunctivae and EOM are normal. Pupils are equal, round, and reactive to light.  Neck: Neck supple.  Cardiovascular: Normal rate and regular rhythm. Exam reveals no gallop and no friction rub.  No murmur heard. Pulmonary/Chest: Effort normal  and breath sounds normal. He has no wheezes. He has no rales.  Lymphadenopathy:    He has no cervical adenopathy.  Neurological: He is alert and oriented to person, place, and time. Gait normal.  Skin: Skin is warm and dry.     ASSESSMENT and PLAN  1. Viral upper respiratory illness  2. Cough Discussed supportive measures for URI: increase hydration, rest, OTC medications, etc. RTC precautions discussed.  Other orders - guaiFENesin-codeine 100-10 MG/5ML syrup; Take 5 mLs by mouth 3 (three) times daily as needed for cough.  Return if symptoms worsen or fail to improve.    Myles LippsIrma M Santiago, MD [facility] [address] [place], [place] [address] Ph.  [phone] Fax  [phone]

## 2016-12-06 NOTE — Patient Instructions (Addendum)
     IF you received an x-ray today, you will receive an invoice from [facility]. Please contact [facility] at [phone] with questions or concerns regarding your invoice.   IF you received labwork today, you will receive an invoice from [facility]. Please contact [facility] at [phone] with questions or concerns regarding your invoice.   Our billing staff will not be able to assist you with questions regarding bills from these companies.  You will be contacted with the lab results as soon as they are available. The fastest way to get your results is to activate your My Chart account. Instructions are located on the last page of this paperwork. If you have not heard from us regarding the results in 2 weeks, please contact this office.     Upper Respiratory Infection, Adult Most upper respiratory infections (URIs) are caused by a virus. A URI affects the nose, throat, and upper air passages. The most common type of URI is often called "the common cold." Follow these instructions at home:  Take medicines only as told by your doctor.  Gargle warm saltwater or take cough drops to comfort your throat as told by your doctor.  Use a warm mist humidifier or inhale steam from a shower to increase air moisture. This may make it easier to breathe.  Drink enough fluid to keep your pee (urine) clear or pale yellow.  Eat soups and other clear broths.  Have a healthy diet.  Rest as needed.  Go back to work when your fever is gone or your doctor says it is okay. ? You may need to stay home longer to avoid giving your URI to others. ? You can also wear a face mask and wash your hands often to prevent spread of the virus.  Use your inhaler more if you have asthma.  Do not use any tobacco products, including cigarettes, chewing tobacco, or electronic cigarettes. If you need help quitting, ask your doctor. Contact a doctor if:  You are getting worse, not better.  Your  symptoms are not helped by medicine.  You have chills.  You are getting more short of breath.  You have brown or red mucus.  You have yellow or brown discharge from your nose.  You have pain in your face, especially when you bend forward.  You have a fever.  You have puffy (swollen) neck glands.  You have pain while swallowing.  You have white areas in the back of your throat. Get help right away if:  You have very bad or constant: ? Headache. ? Ear pain. ? Pain in your forehead, behind your eyes, and over your cheekbones (sinus pain). ? Chest pain.  You have long-lasting (chronic) lung disease and any of the following: ? Wheezing. ? Long-lasting cough. ? Coughing up blood. ? A change in your usual mucus.  You have a stiff neck.  You have changes in your: ? Vision. ? Hearing. ? Thinking. ? Mood. This information is not intended to replace advice given to you by your health care provider. Make sure you discuss any questions you have with your health care provider. Document Released: 06/19/2007 Document Revised: 09/03/2015 Document Reviewed: 04/07/2013 Elsevier Interactive Patient Education  2018 Elsevier Inc.  

## 2016-12-11 ENCOUNTER — Ambulatory Visit: Payer: Self-pay | Admitting: *Deleted

## 2016-12-11 NOTE — Telephone Encounter (Signed)
  Reason for Disposition . Cough has been present for > 3 weeks  Answer Assessment - Initial Assessment Questions 1. ONSET: "When did the cough begin?"      Last Week 2. SEVERITY: "How bad is the cough today?"      Rating cough at about 5 3. RESPIRATORY DISTRESS: "Describe your breathing."      No 4. FEVER: "Do you have a fever?" If so, ask: "What is your temperature, how was it measured, and when did it start?"     Unsure, has not measured 5. SPUTUM: "Describe the color of your sputum" (clear, white, yellow, green)     Yellowish,greenish 6. HEMOPTYSIS: "Are you coughing up any blood?" If so ask: "How much?" (flecks, streaks, tablespoons, etc.)     No 7. CARDIAC HISTORY: "Do you have any history of heart disease?" (e.g., heart attack, congestive heart failure)      No 8. LUNG HISTORY: "Do you have any history of lung disease?"  (e.g., pulmonary embolus, asthma, emphysema)     No 9. PE RISK FACTORS: "Do you have a history of blood clots?" (or: recent major surgery, recent prolonged travel, bedridden )    No 10. OTHER SYMPTOMS: "Do you have any other symptoms?" (e.g., runny nose, wheezing, chest pain)       wheezing 111. TRAVEL: "Have you traveled out of the country in the last month?" (e.g., travel history, exposures)       No  Protocols used: COUGH - ACUTE PRODUCTIVE-A-AH  Pt having complaints of increased tiredness, feeling achy and having chills. Pt states that he has tried Delsym, Robitussin and was prescribed cough syrup with codeine but has not noticed any improvement. Appt made for Nov 29.

## 2016-12-12 ENCOUNTER — Encounter: Payer: Self-pay | Admitting: Family Medicine

## 2016-12-12 ENCOUNTER — Ambulatory Visit (INDEPENDENT_AMBULATORY_CARE_PROVIDER_SITE_OTHER): Payer: BLUE CROSS/BLUE SHIELD

## 2016-12-12 ENCOUNTER — Other Ambulatory Visit: Payer: Self-pay

## 2016-12-12 ENCOUNTER — Ambulatory Visit: Payer: BLUE CROSS/BLUE SHIELD | Admitting: Family Medicine

## 2016-12-12 VITALS — BP 110/72 | HR 115 | Temp 99.9°F | Resp 18 | Ht 69.0 in | Wt 175.2 lb

## 2016-12-12 DIAGNOSIS — R05 Cough: Secondary | ICD-10-CM

## 2016-12-12 DIAGNOSIS — J029 Acute pharyngitis, unspecified: Secondary | ICD-10-CM | POA: Diagnosis not present

## 2016-12-12 DIAGNOSIS — R059 Cough, unspecified: Secondary | ICD-10-CM

## 2016-12-12 DIAGNOSIS — R509 Fever, unspecified: Secondary | ICD-10-CM

## 2016-12-12 LAB — POCT CBC
Granulocyte percent: 89.1 %G — AB (ref 37–80)
HCT, POC: 48 % (ref 43.5–53.7)
Hemoglobin: 16.3 g/dL (ref 14.1–18.1)
Lymph, poc: 1 (ref 0.6–3.4)
MCH, POC: 31.1 pg (ref 27–31.2)
MCHC: 34.1 g/dL (ref 31.8–35.4)
MCV: 92 fL (ref 80–97)
MID (cbc): 0.4 (ref 0–0.9)
MPV: 7.4 fL (ref 0–99.8)
POC Granulocyte: 11.7 — AB (ref 2–6.9)
POC LYMPH PERCENT: 7.6 %L — AB (ref 10–50)
POC MID %: 3.3 %M (ref 0–12)
Platelet Count, POC: 346 10*3/uL (ref 142–424)
RBC: 5.22 M/uL (ref 4.69–6.13)
RDW, POC: 13.2 %
WBC: 13.1 10*3/uL — AB (ref 4.6–10.2)

## 2016-12-12 LAB — POC INFLUENZA A&B (BINAX/QUICKVUE)
Influenza A, POC: NEGATIVE
Influenza B, POC: NEGATIVE

## 2016-12-12 LAB — POCT RAPID STREP A (OFFICE): Rapid Strep A Screen: NEGATIVE

## 2016-12-12 MED ORDER — MECLIZINE HCL 25 MG PO TABS: 25.0000 mg | ORAL_TABLET | Freq: Three times a day (TID) | ORAL | 0 refills | Status: DC | PRN

## 2016-12-12 MED ORDER — AMOXICILLIN 500 MG PO CAPS: 500.0000 mg | ORAL_CAPSULE | Freq: Two times a day (BID) | ORAL | 0 refills | Status: DC

## 2016-12-12 NOTE — Progress Notes (Signed)
11/29/20189:55 AM  John Pacheco 09/10/1970, 46 y.o. male [id number]  Chief Complaint  Patient presents with  . Cough    started yesterday the coughing kept pt up all night  . Chills    felt really hot last night   . Headache    pt took ibuprofen which helped some   . Nausea  . Depression    screening was an 2111     HPI:   Patient is a 46 y.o. male  who presents today for worsening productive cough over the past several days and fever and chills, malaise, body aches and sore throat that started last night. He has been taking ibuprofen scheduled. He has also been feeling dizzy. He denies any SOB, wheezing, vomiting, diarrhea, rash.  [facility] 12/12/2016 12/06/2016 10/02/2016  Decreased Interest 1 0 0  Down, Depressed, Hopeless 1 0 0  PHQ - 2 Score 2 0 0  Altered sleeping 2 - -  Tired, decreased energy 3 - -  Change in appetite 3 - -  Feeling bad or failure about yourself  0 - -  Trouble concentrating 0 - -  Moving slowly or fidgety/restless 1 - -  Suicidal thoughts 0 - -  PHQ-9 Score 11 - -    No Known Allergies  Prior to Admission medications   Medication Sig Start Date End Date Taking? Authorizing Provider  cholecalciferol (VITAMIN D) 1000 units tablet Take 2,000 Units by mouth daily.   Yes [provider]  fenofibrate 160 MG tablet Take 160 mg by mouth daily.   Yes [provider]  guaiFENesin-codeine 100-10 MG/5ML syrup Take 5 mLs by mouth 3 (three) times daily as needed for cough. 12/06/16  Yes Myles LippsSantiago, Mendell Bontempo M, MD  hydrOXYzine (ATARAX/VISTARIL) 25 MG tablet Take 25 mg by mouth at bedtime.   Yes [provider]  ibuprofen (ADVIL,MOTRIN) 200 MG tablet Take 400 mg by mouth every 6 (six) hours as needed for headache (or pain).   Yes [provider]  omeprazole (PRILOSEC) 40 MG capsule Take 40 mg by mouth daily.   Yes [provider]  Propylene Glycol (SYSTANE BALANCE) 0.6 % SOLN Place 1-2 drops into both eyes  daily as needed (for itching).   Yes [provider]  methocarbamol (ROBAXIN) 500 MG tablet Take 1-2 tablets (500-1,000 mg total) by mouth every 6 (six) hours as needed (pain). Patient not taking: Reported on 10/02/2016 12/12/15   Trixie DredgeWest, Emily, PA-C    Past Medical History:  Diagnosis Date  . Reflux     Past Surgical History:  Procedure Laterality Date  . ELBOW SURGERY    . EYE SURGERY    . feet surgery     flat feet  . HERNIA REPAIR    . right leg fracture s/p repair      Social History   Tobacco Use  . Smoking status: Never Smoker  . Smokeless tobacco: Never Used  Substance Use Topics  . Alcohol use: No    Family History  Problem Relation Age of Onset  . Mental illness Mother   . Heart disease Mother   . Hyperlipidemia Mother   . Heart disease Father   . Hyperlipidemia Sister     ROS Per hpi  OBJECTIVE:  Blood pressure 110/72, pulse (!) 115, temperature 99.9 F (37.7 C), temperature source Oral, resp. rate 18, height 5\' 9"  (1.753 m), weight 175 lb 3.2 oz (79.5 kg), SpO2 97 %.  Physical Exam Gen: AA0x3, NAD,  mildly ill appearing HEENT: [place]/AT, EOMI/PERRLA, TMs pearly gray, OP w erythema and scant exudates CV: mild tachycardic but regular, no m/r/g Resp: Coarse diffuse breath sounds, no wheezing or rales Skin: clamy  Results for orders placed or performed in visit on 12/12/16 (from the past 24 hour(s))  POCT rapid strep A     Status: Normal   Collection Time: 12/12/16 10:48 AM  Result Value Ref Range   Rapid Strep A Screen Negative Negative  POC Influenza A&B(BINAX/QUICKVUE)     Status: Normal   Collection Time: 12/12/16 10:49 AM  Result Value Ref Range   Influenza A, POC Negative Negative   Influenza B, POC Negative Negative  POCT CBC     Status: Abnormal   Collection Time: 12/12/16 10:56 AM  Result Value Ref Range   WBC 13.1 (A) 4.6 - 10.2 K/uL   Lymph, poc 1.0 0.6 - 3.4   POC LYMPH PERCENT 7.6 (A) 10 - 50 %L   MID (cbc) 0.4 0 - 0.9   POC  MID % 3.3 0 - 12 %M   POC Granulocyte 11.7 (A) 2 - 6.9   Granulocyte percent 89.1 (A) 37 - 80 %G   RBC 5.22 4.69 - 6.13 M/uL   Hemoglobin 16.3 14.1 - 18.1 g/dL   HCT, POC 16.148.0 09.643.5 - 53.7 %   MCV 92.0 80 - 97 fL   MCH, POC 31.1 27 - 31.2 pg   MCHC 34.1 31.8 - 35.4 g/dL   RDW, POC 04.513.2 %   Platelet Count, POC 346 142 - 424 K/uL   MPV 7.4 0 - 99.8 fL    Dg Chest 2 View  Result Date: 12/12/2016 CLINICAL DATA:  Worsening productive cough, fever, chills EXAM: CHEST  2 VIEW COMPARISON:  12/04/2015 FINDINGS: Lingular scarring. Lungs otherwise clear. Heart is normal size. No effusions or acute bony abnormality. IMPRESSION: No active cardiopulmonary disease. Electronically Signed   By: Charlett NoseKevin  Dover M.D.   On: 12/12/2016 10:58     ASSESSMENT and PLAN 1. Acute pharyngitis, unspecified etiology Discussed supportive measures, new meds r/se/b and RTC precautions. Patient educational handout given. - POCT rapid strep A - Culture, Group A Strep  2. Cough - POCT CBC - DG Chest 2 View; Future  3. Fever and chills - POC Influenza A&B(BINAX/QUICKVUE) - POCT CBC - DG Chest 2 View; Future  Other orders - amoxicillin (AMOXIL) 500 MG capsule; Take 1 capsule (500 mg total) by mouth 2 (two) times daily. - meclizine (ANTIVERT) 25 MG tablet; Take 1 tablet (25 mg total) by mouth 3 (three) times daily as needed for dizziness.  Return if symptoms worsen or fail to improve.    Myles LippsIrma M Santiago, MD [facility] at [facility] [address] [place], [place] [address] Ph.  [phone] Fax [phone]

## 2016-12-12 NOTE — Patient Instructions (Addendum)
     IF you received an x-ray today, you will receive an invoice from [facility]. Please contact [facility] at [phone] with questions or concerns regarding your invoice.   IF you received labwork today, you will receive an invoice from [facility]. Please contact [facility] at [phone] with questions or concerns regarding your invoice.   Our billing staff will not be able to assist you with questions regarding bills from these companies.  You will be contacted with the lab results as soon as they are available. The fastest way to get your results is to activate your My Chart account. Instructions are located on the last page of this paperwork. If you have not heard from us regarding the results in 2 weeks, please contact this office.    Pharyngitis Pharyngitis is redness, pain, and swelling (inflammation) of your pharynx. What are the causes? Pharyngitis is usually caused by infection. Most of the time, these infections are from viruses (viral) and are part of a cold. However, sometimes pharyngitis is caused by bacteria (bacterial). Pharyngitis can also be caused by allergies. Viral pharyngitis may be spread from person to person by coughing, sneezing, and personal items or utensils (cups, forks, spoons, toothbrushes). Bacterial pharyngitis may be spread from person to person by more intimate contact, such as kissing. What are the signs or symptoms? Symptoms of pharyngitis include:  Sore throat.  Tiredness (fatigue).  Low-grade fever.  Headache.  Joint pain and muscle aches.  Skin rashes.  Swollen lymph nodes.  Plaque-like film on throat or tonsils (often seen with bacterial pharyngitis).  How is this diagnosed? Your health care provider will ask you questions about your illness and your symptoms. Your medical history, along with a physical exam, is often all that is needed to diagnose pharyngitis. Sometimes, a rapid strep test is done. Other lab tests may  also be done, depending on the suspected cause. How is this treated? Viral pharyngitis will usually get better in 3-4 days without the use of medicine. Bacterial pharyngitis is treated with medicines that kill germs (antibiotics). Follow these instructions at home:  Drink enough water and fluids to keep your urine clear or pale yellow.  Only take over-the-counter or prescription medicines as directed by your health care provider: ? If you are prescribed antibiotics, make sure you finish them even if you start to feel better. ? Do not take aspirin.  Get lots of rest.  Gargle with 8 oz of salt water ( tsp of salt per 1 qt of water) as often as every 1-2 hours to soothe your throat.  Throat lozenges (if you are not at risk for choking) or sprays may be used to soothe your throat. Contact a health care provider if:  You have large, tender lumps in your neck.  You have a rash.  You cough up green, yellow-brown, or bloody spit. Get help right away if:  Your neck becomes stiff.  You drool or are unable to swallow liquids.  You vomit or are unable to keep medicines or liquids down.  You have severe pain that does not go away with the use of recommended medicines.  You have trouble breathing (not caused by a stuffy nose). This information is not intended to replace advice given to you by your health care provider. Make sure you discuss any questions you have with your health care provider. Document Released: 12/31/2004 Document Revised: 06/08/2015 Document Reviewed: 09/07/2012 Elsevier Interactive Patient Education  2017 Elsevier Inc.  

## 2016-12-14 ENCOUNTER — Telehealth: Payer: Self-pay | Admitting: Family Medicine

## 2016-12-14 DIAGNOSIS — R079 Chest pain, unspecified: Secondary | ICD-10-CM | POA: Diagnosis not present

## 2016-12-14 DIAGNOSIS — R509 Fever, unspecified: Secondary | ICD-10-CM | POA: Diagnosis not present

## 2016-12-14 DIAGNOSIS — R05 Cough: Secondary | ICD-10-CM | POA: Diagnosis not present

## 2016-12-14 DIAGNOSIS — J029 Acute pharyngitis, unspecified: Secondary | ICD-10-CM | POA: Diagnosis not present

## 2016-12-14 LAB — CULTURE, GROUP A STREP: Strep A Culture: NEGATIVE

## 2016-12-14 NOTE — Telephone Encounter (Signed)
Pt's wife called very upset because her husband had been seen twice here this week and he was getting worse.  He was on the phone with the after hours triage nurse who told him he needs to be seen.  The call came in around 3:20 pm and we had no appointments left.  I advised to go to [facility] or [facility].  She believes that because "our triage" nurse told him to be seen, we should stand by that.  I told her that was not our policy.  Please call patient asap [phone].

## 2016-12-16 NOTE — Telephone Encounter (Signed)
I spoke with patient via phone. Was seen on Saturday at urgent care in [place]. Retested for flu and cxr which were both negative. Was rx prednisone, azithromycin and cheritussin. Today doing better. Able to eat and drink. Has more energy. Discussed rtc precautions.

## 2017-06-27 DIAGNOSIS — N509 Disorder of male genital organs, unspecified: Secondary | ICD-10-CM | POA: Diagnosis not present

## 2017-06-30 ENCOUNTER — Other Ambulatory Visit: Payer: Self-pay | Admitting: Internal Medicine

## 2017-06-30 ENCOUNTER — Other Ambulatory Visit (HOSPITAL_COMMUNITY): Payer: Self-pay | Admitting: Internal Medicine

## 2017-06-30 DIAGNOSIS — N5089 Other specified disorders of the male genital organs: Secondary | ICD-10-CM

## 2017-07-21 DIAGNOSIS — F411 Generalized anxiety disorder: Secondary | ICD-10-CM | POA: Diagnosis not present

## 2017-08-01 ENCOUNTER — Other Ambulatory Visit: Payer: Self-pay | Admitting: Internal Medicine

## 2017-08-01 DIAGNOSIS — N5089 Other specified disorders of the male genital organs: Secondary | ICD-10-CM

## 2017-08-08 ENCOUNTER — Ambulatory Visit
Admission: RE | Admit: 2017-08-08 | Discharge: 2017-08-08 | Disposition: A | Discharge status: Home / Self Care | Payer: Managed Care, Other (non HMO) | Source: Ambulatory Visit | Attending: Internal Medicine | Admitting: Internal Medicine

## 2017-08-08 ENCOUNTER — Other Ambulatory Visit: Payer: Managed Care, Other (non HMO)

## 2017-08-08 DIAGNOSIS — I861 Scrotal varices: Secondary | ICD-10-CM | POA: Diagnosis not present

## 2017-08-08 DIAGNOSIS — N5089 Other specified disorders of the male genital organs: Secondary | ICD-10-CM

## 2017-09-03 DIAGNOSIS — G4709 Other insomnia: Secondary | ICD-10-CM | POA: Diagnosis not present

## 2017-09-03 DIAGNOSIS — E7849 Other hyperlipidemia: Secondary | ICD-10-CM | POA: Diagnosis not present

## 2017-09-03 DIAGNOSIS — R1013 Epigastric pain: Secondary | ICD-10-CM | POA: Diagnosis not present

## 2017-09-03 DIAGNOSIS — Z0000 Encounter for general adult medical examination without abnormal findings: Secondary | ICD-10-CM | POA: Diagnosis not present

## 2017-09-10 DIAGNOSIS — R1013 Epigastric pain: Secondary | ICD-10-CM | POA: Diagnosis not present

## 2017-09-10 DIAGNOSIS — Z0000 Encounter for general adult medical examination without abnormal findings: Secondary | ICD-10-CM | POA: Diagnosis not present

## 2017-09-10 DIAGNOSIS — Z125 Encounter for screening for malignant neoplasm of prostate: Secondary | ICD-10-CM | POA: Diagnosis not present

## 2017-09-10 DIAGNOSIS — Z1389 Encounter for screening for other disorder: Secondary | ICD-10-CM | POA: Diagnosis not present

## 2017-09-10 DIAGNOSIS — E7849 Other hyperlipidemia: Secondary | ICD-10-CM | POA: Diagnosis not present

## 2017-10-20 DIAGNOSIS — Z23 Encounter for immunization: Secondary | ICD-10-CM | POA: Diagnosis not present

## 2017-11-03 DIAGNOSIS — F411 Generalized anxiety disorder: Secondary | ICD-10-CM | POA: Diagnosis not present

## 2017-11-04 DIAGNOSIS — G4733 Obstructive sleep apnea (adult) (pediatric): Secondary | ICD-10-CM | POA: Diagnosis not present

## 2018-01-28 DIAGNOSIS — M542 Cervicalgia: Secondary | ICD-10-CM | POA: Diagnosis not present

## 2018-01-28 DIAGNOSIS — M25532 Pain in left wrist: Secondary | ICD-10-CM | POA: Diagnosis not present

## 2018-01-28 DIAGNOSIS — M79642 Pain in left hand: Secondary | ICD-10-CM | POA: Diagnosis not present

## 2018-01-31 IMAGING — DX DG CHEST 2V
2 series · 2 of 2 positions shown · non-contrast
Comparison: None.

CLINICAL DATA: Chest pain.

EXAM:
CHEST  2 VIEW

[w chest pa]
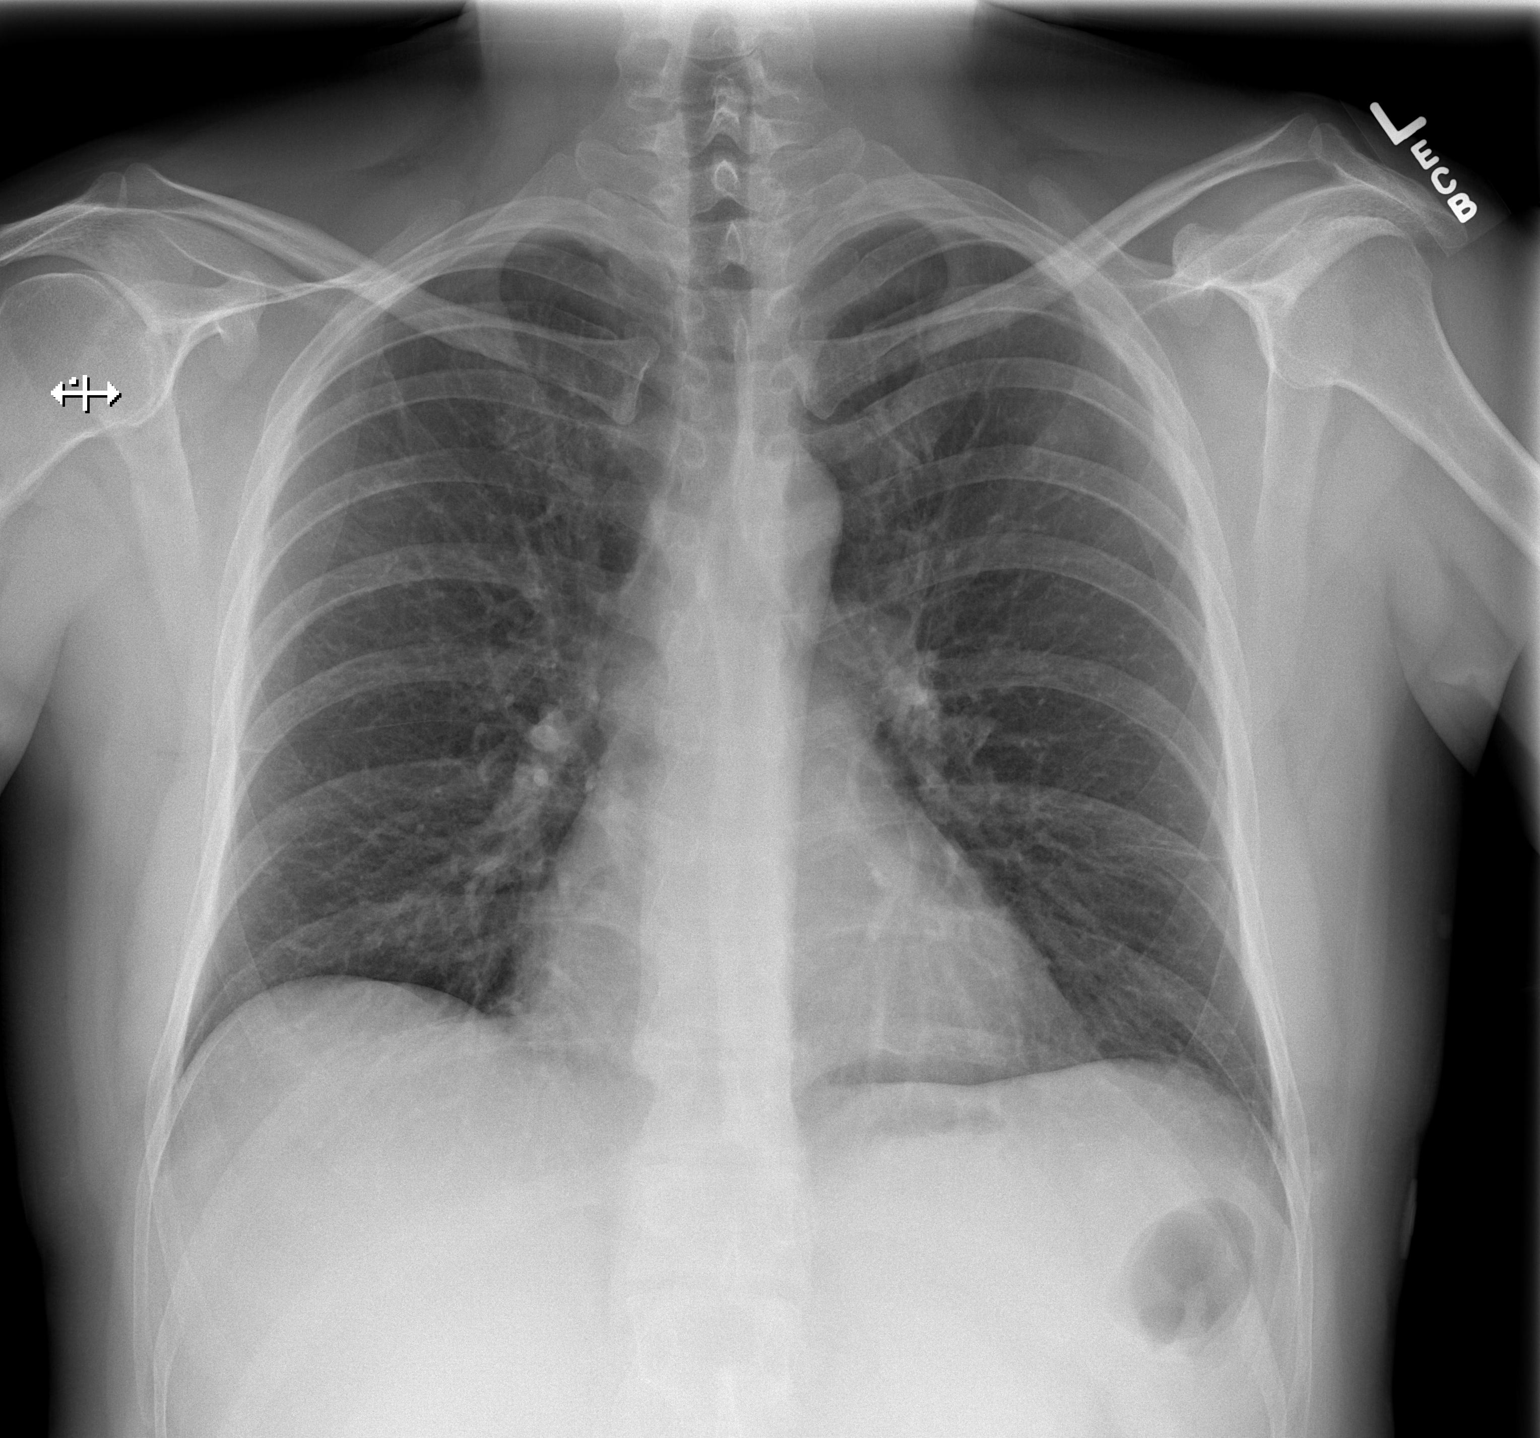

[w chest lat]
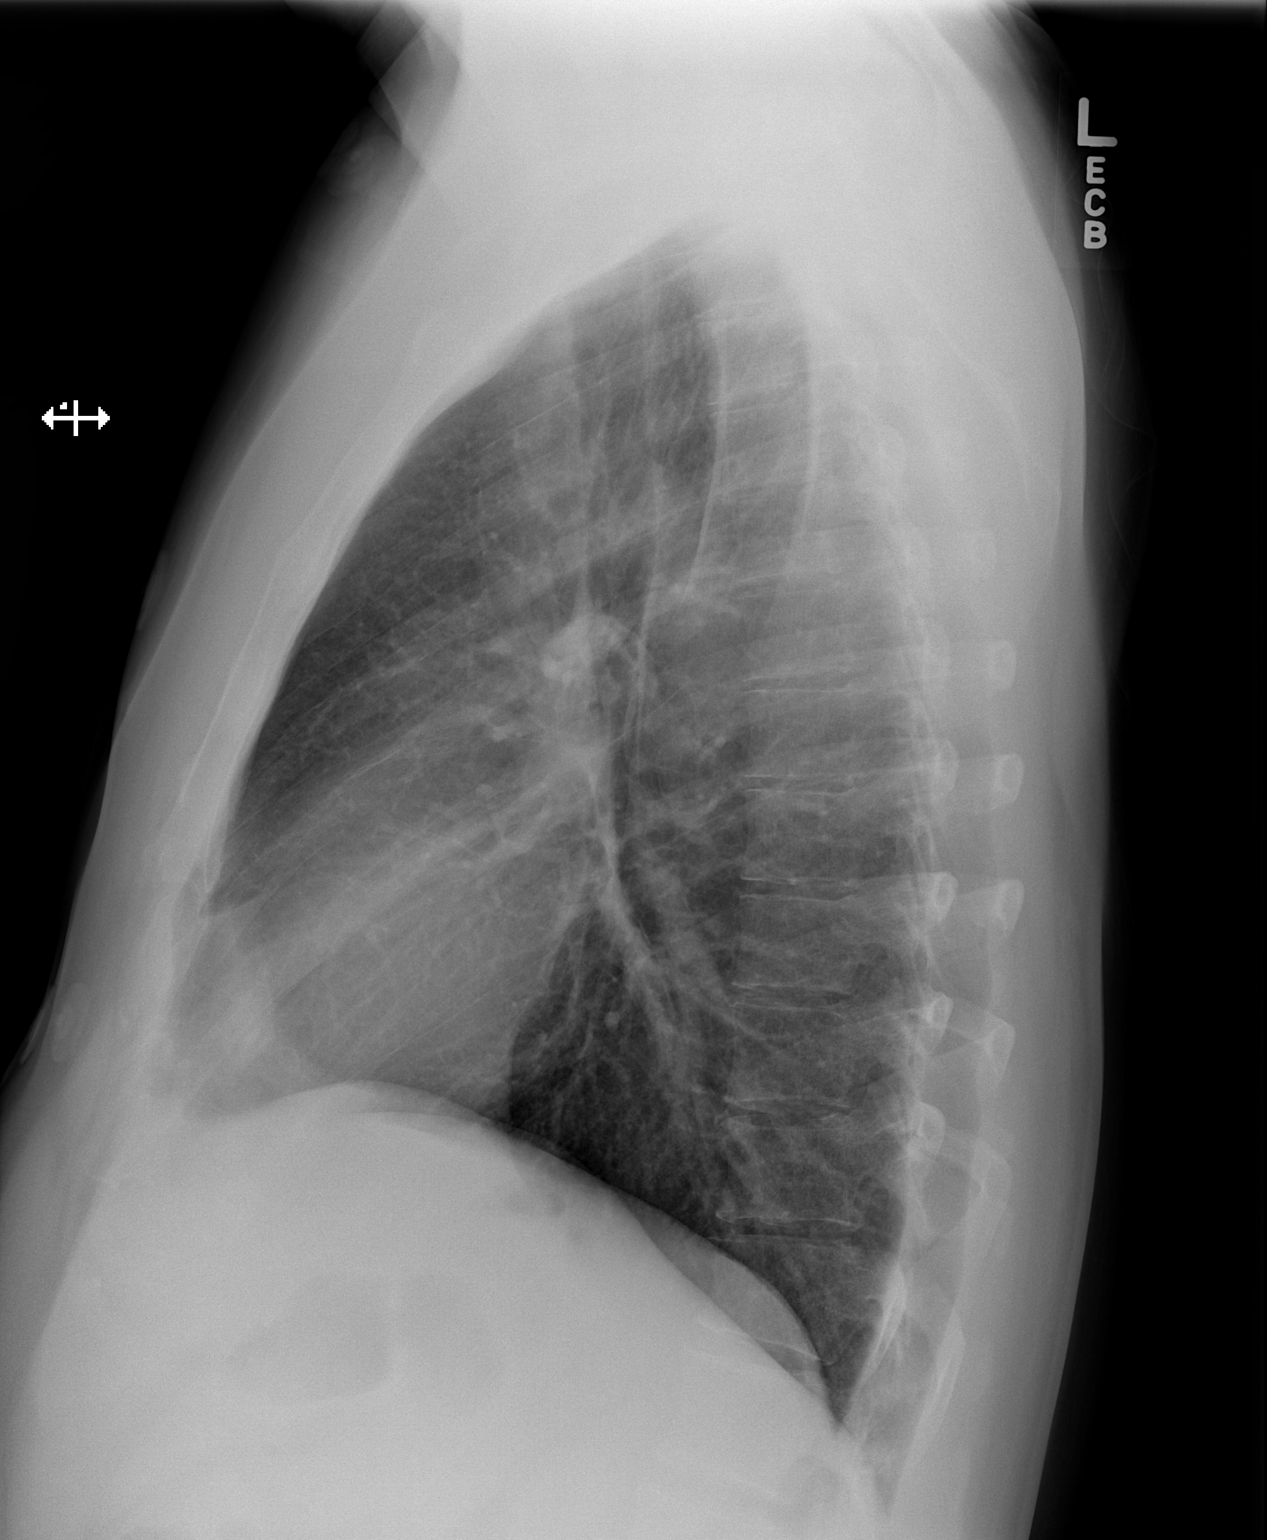

[2 of 2 positions shown; findings below may reference images not displayed]

FINDINGS: Normal heart size and mediastinal contours. Apparent ovoid density
overlapping the lower trachea in the lateral projection is likely
hilar vessels. No infiltrate or edema. No effusion or pneumothorax.
No acute osseous findings.
IMPRESSION: No active cardiopulmonary disease.

## 2018-02-13 DIAGNOSIS — G4733 Obstructive sleep apnea (adult) (pediatric): Secondary | ICD-10-CM | POA: Diagnosis not present

## 2018-02-17 DIAGNOSIS — G4733 Obstructive sleep apnea (adult) (pediatric): Secondary | ICD-10-CM | POA: Diagnosis not present

## 2018-03-24 DIAGNOSIS — F411 Generalized anxiety disorder: Secondary | ICD-10-CM | POA: Diagnosis not present

## 2018-06-01 DIAGNOSIS — E7849 Other hyperlipidemia: Secondary | ICD-10-CM | POA: Diagnosis not present

## 2018-06-01 DIAGNOSIS — R1013 Epigastric pain: Secondary | ICD-10-CM | POA: Diagnosis not present

## 2018-06-01 DIAGNOSIS — M25511 Pain in right shoulder: Secondary | ICD-10-CM | POA: Diagnosis not present

## 2018-06-01 DIAGNOSIS — G4733 Obstructive sleep apnea (adult) (pediatric): Secondary | ICD-10-CM | POA: Diagnosis not present

## 2019-02-01 IMAGING — DX DG CHEST 2V
2 series · 2 of 2 positions shown · non-contrast
Comparison: 12/04/2015

CLINICAL DATA: Worsening productive cough, fever, chills

EXAM:
CHEST  2 VIEW

[chest pa]
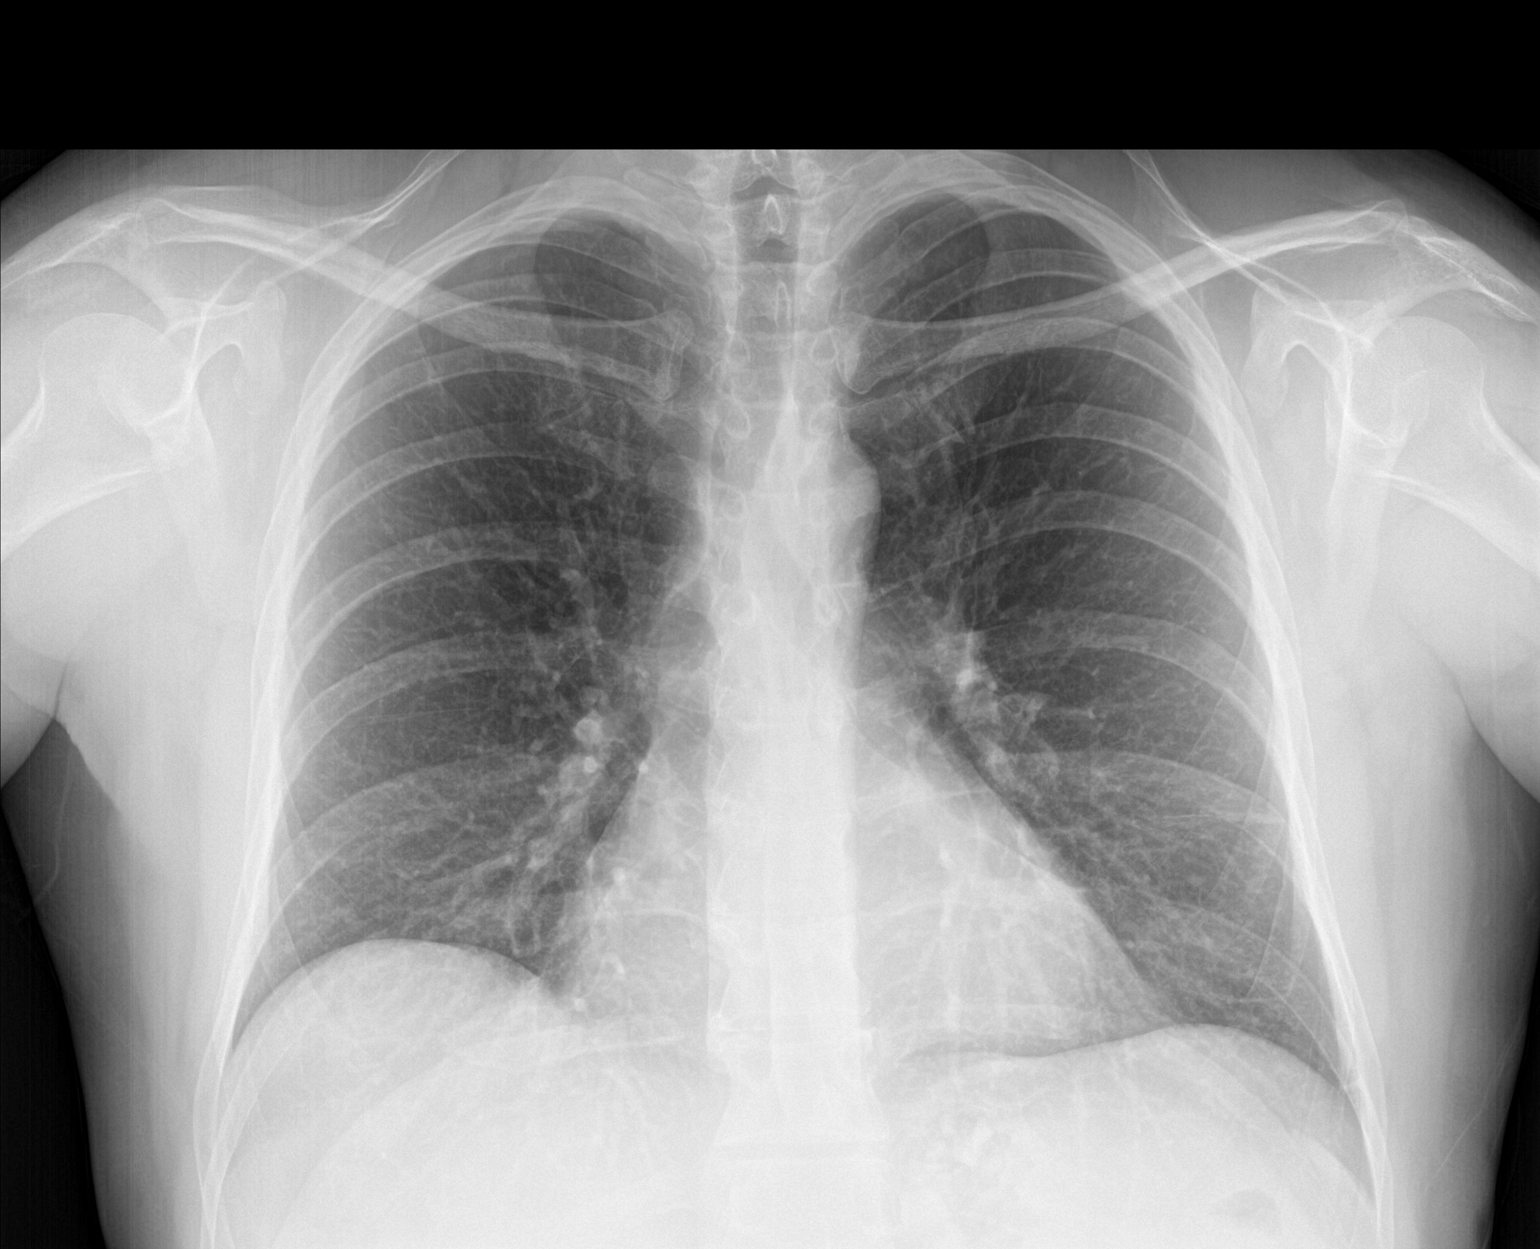

[chest lat]
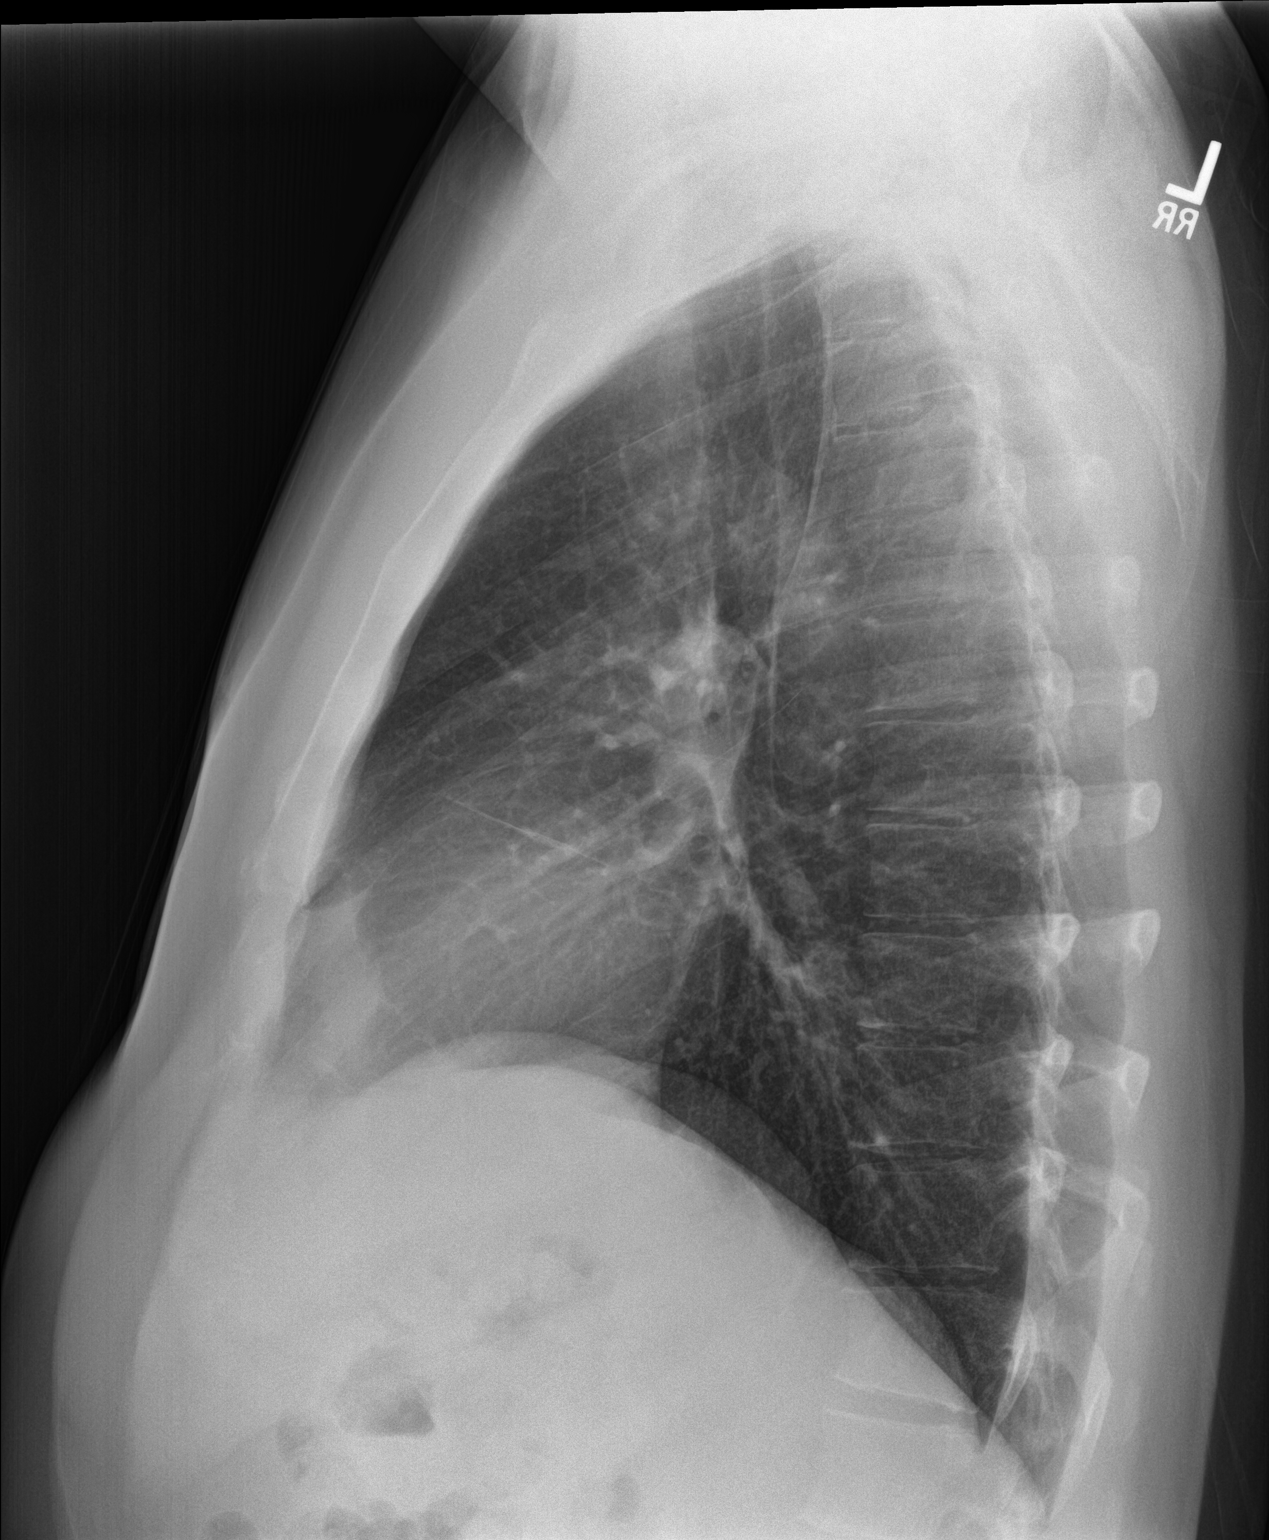

[2 of 2 positions shown; findings below may reference images not displayed]

FINDINGS: Lingular scarring. Lungs otherwise clear. Heart is normal size. No
effusions or acute bony abnormality.
IMPRESSION: No active cardiopulmonary disease.

## 2019-11-03 IMAGING — US US SCROTUM W/ DOPPLER COMPLETE
1 series · 14 of 25 positions shown · non-contrast
Comparison: None.

CLINICAL DATA: Initial evaluation for scrotal mass. History of
prior vasectomy.

EXAM:
SCROTAL ULTRASOUND
DOPPLER ULTRASOUND OF THE TESTICLES
TECHNIQUE: Complete ultrasound examination of the testicles, epididymis, and
other scrotal structures was performed. Color and spectral Doppler
ultrasound were also utilized to evaluate blood flow to the
testicles.

[Series 1: us scrotum w/ doppler complete · 0.07mm/px · 14 of 67 slices shown]
[im 1/67]
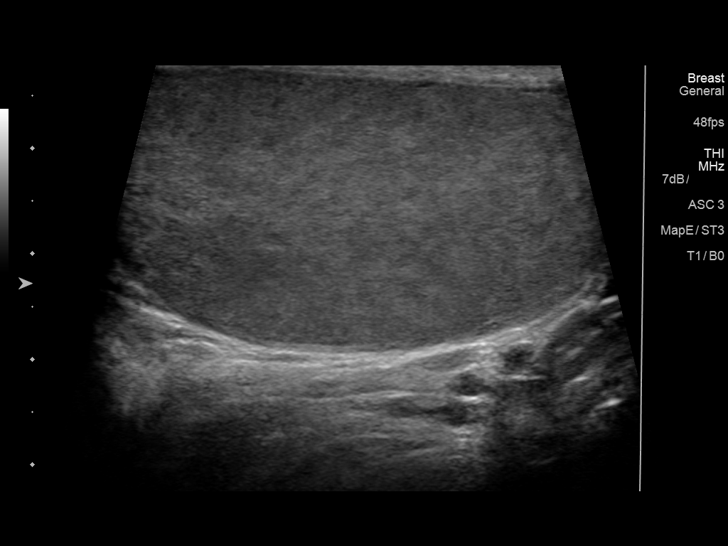
[im 6/67]
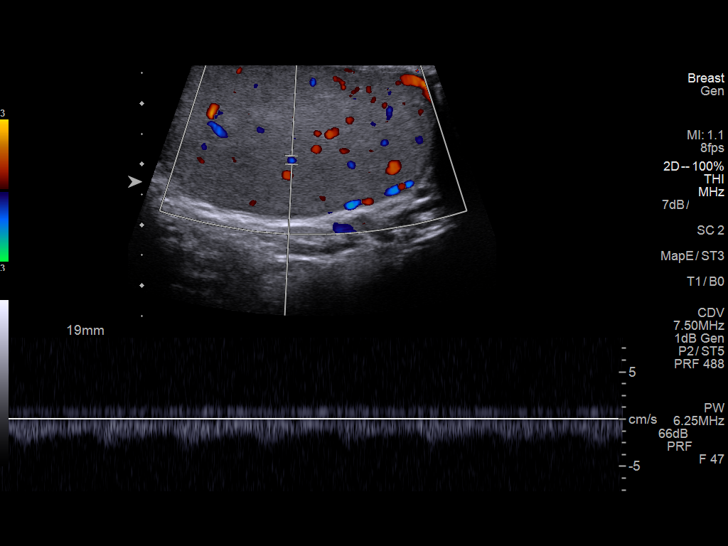
[im 12/67]
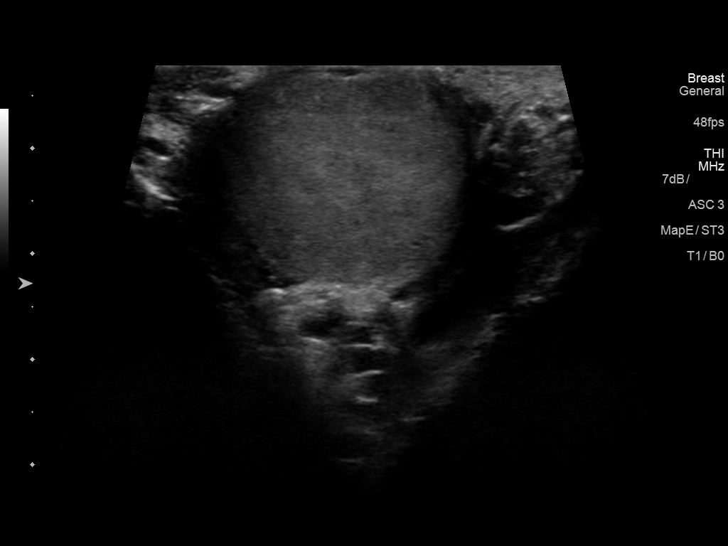
[im 17/67]
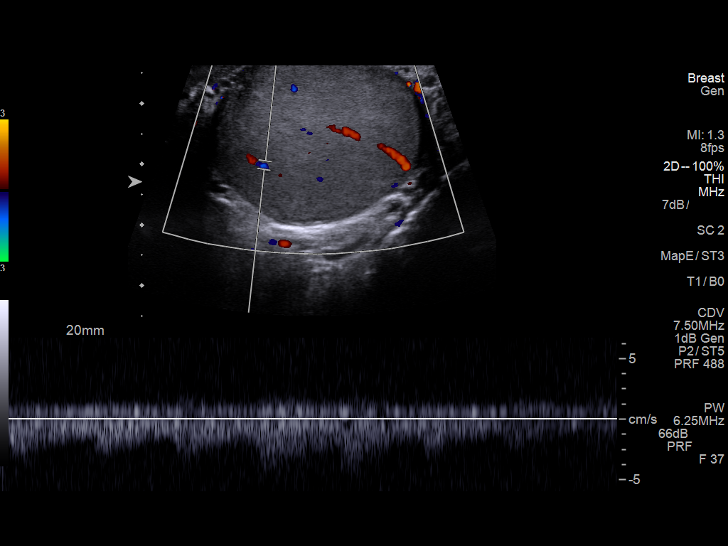
[im 23/67]
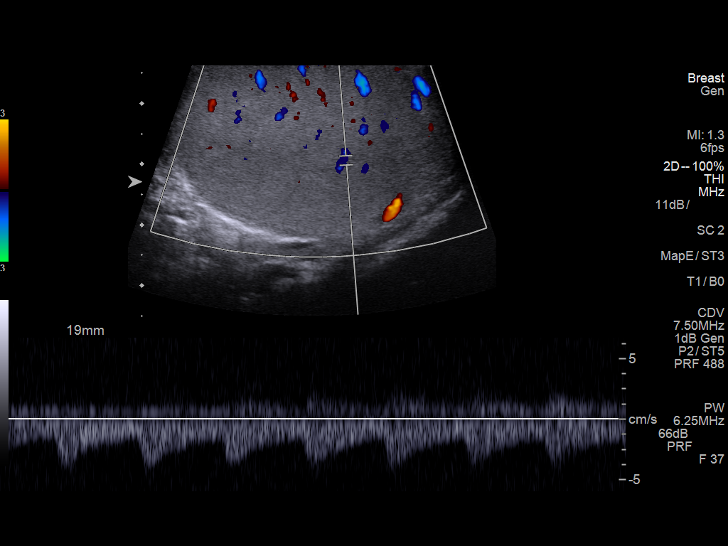
[im 25/67]
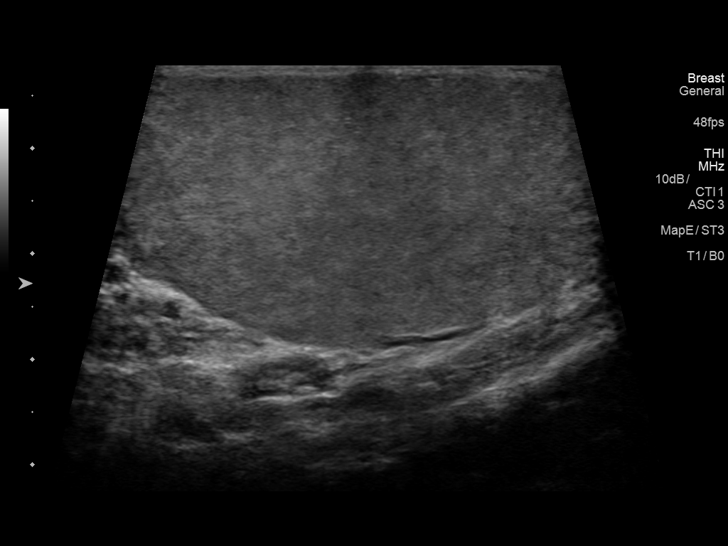
[im 31/67]
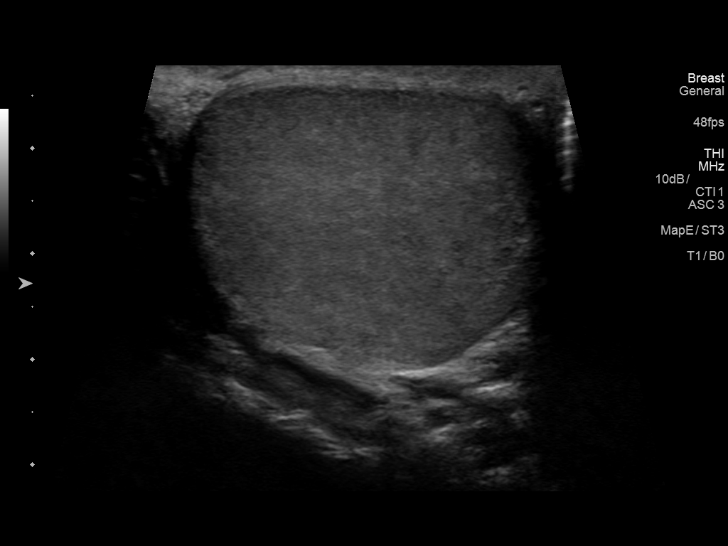
[im 36/67]
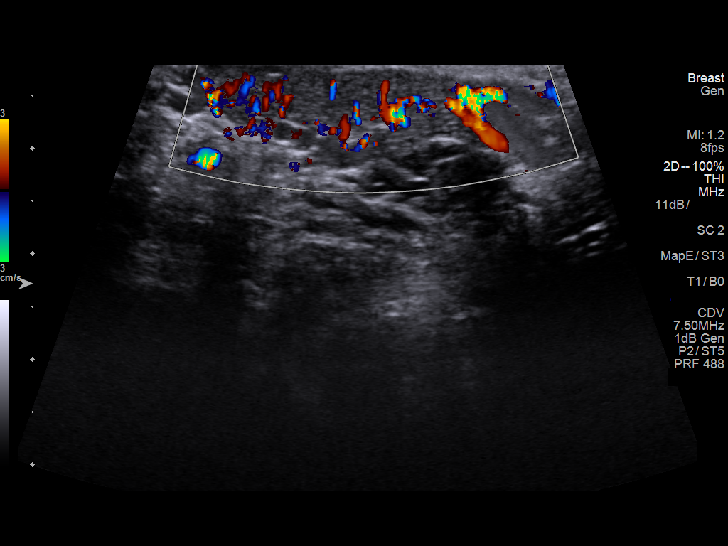
[im 42/67]
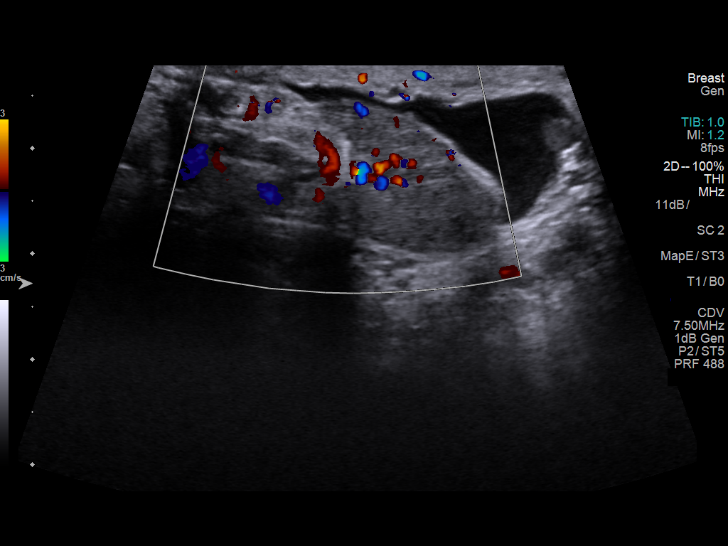
[im 45/67]
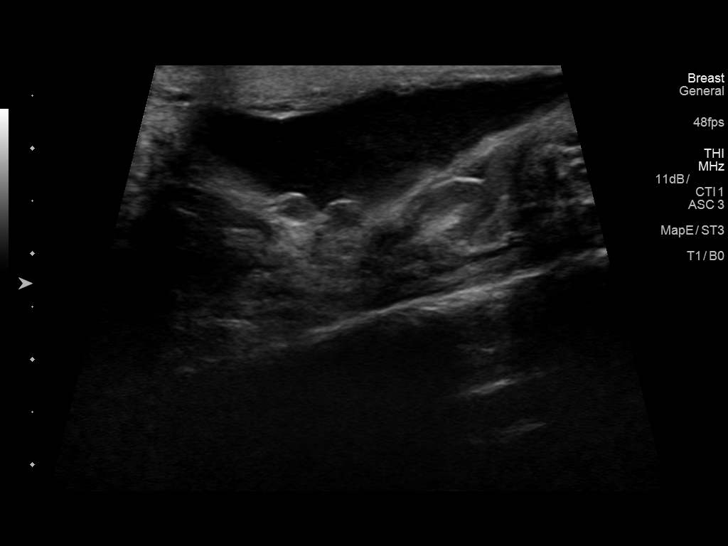
[im 50/67]
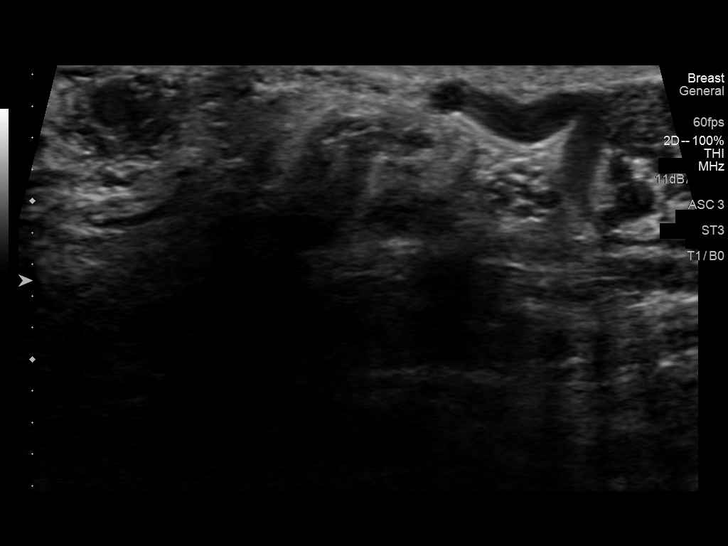
[im 56/67]
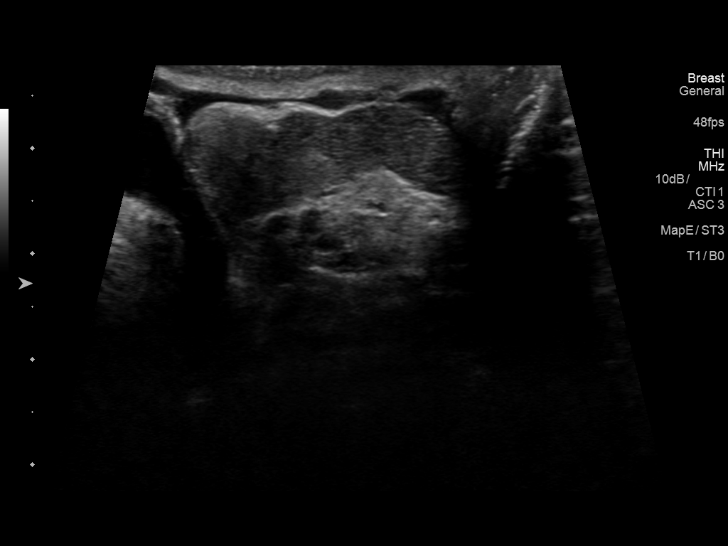
[im 61/67]
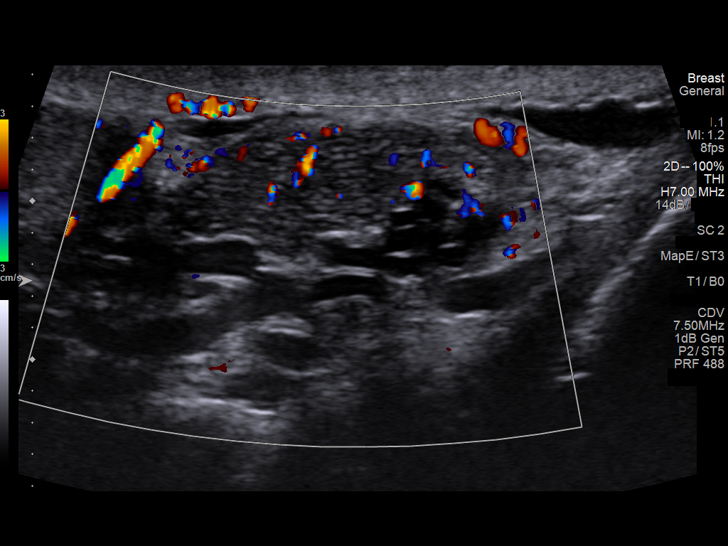
[im 67/67]
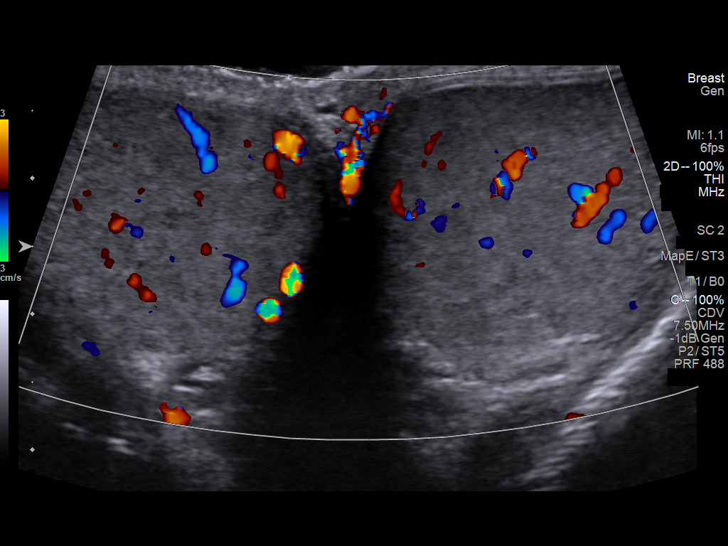

[14 of 25 positions shown; findings below may reference images not displayed]

FINDINGS: Right testicle

Measurements: 4.4 x 2.7 x 3.3 cm. No mass or microlithiasis
visualized.

Left testicle

Measurements: 5.0 x 2.9 x 3.2 cm. No mass or microlithiasis
visualized.

Right epididymis:  Normal in size and appearance.

Left epididymis:  Normal in size and appearance.

Hydrocele: Small bilateral hydroceles, of doubtful clinical
significance.

Varicocele:  Bilateral varicoceles present.

Pulsed Doppler interrogation of both testes demonstrates normal low
resistance arterial and venous waveforms bilaterally.
IMPRESSION: 1. Small bilateral varicoceles.
2. Otherwise negative scrotal ultrasound. No intra testicular mass
or other abnormality.

## 2021-06-27 ENCOUNTER — Ambulatory Visit
Admission: RE | Admit: 2021-06-27 | Discharge: 2021-06-27 | Disposition: A | Discharge status: Home / Self Care | Payer: Managed Care, Other (non HMO) | Source: Ambulatory Visit | Attending: Internal Medicine | Admitting: Internal Medicine

## 2021-06-27 VITALS — BP 117/83 | HR 100 | Temp 98.0°F | Resp 18

## 2021-06-27 DIAGNOSIS — R051 Acute cough: Secondary | ICD-10-CM

## 2021-06-27 MED ORDER — BENZONATATE 100 MG PO CAPS: 100.0000 mg | ORAL_CAPSULE | Freq: Three times a day (TID) | ORAL | 0 refills | Status: DC | PRN

## 2021-06-27 NOTE — Discharge Instructions (Signed)
Take medications as prescribed Humidifier use will be helpful in reducing cough Maintain adequate hydration No indication for COVID testing Return to urgent care if you have any other concerns.

## 2021-06-27 NOTE — ED Provider Notes (Signed)
[facility]    CSN: [id number] Arrival date & time: 06/27/21  1244      History   Chief Complaint Chief Complaint  Patient presents with   Cough    I have a bad cough, scratchy , sore , tickets , dry. - Entered by patient    HPI John Pacheco is a 51 y.o. male comes to the urgent care with 3-day history of nonproductive cough.  Onset of cough was insidious and has been persistent.  He denies any sore throat.  No nausea, vomiting or diarrhea.  No fever or chills.  No shortness of breath, chest tightness or chest pain.  No wheezing.  Patient denies any tobacco use.  No history of sick contacts.  Patient has tried over-the-counter medication with no improvement in the level of cough.  He has a history of obstructive sleep apnea and uses a CPAP regularly.  His CPAP is humidified.   HPI  Past Medical History:  Diagnosis Date   Reflux     Patient Active Problem List   Diagnosis Date Noted   Left knee pain 06/01/2012    Past Surgical History:  Procedure Laterality Date   ELBOW SURGERY     EYE SURGERY     feet surgery     flat feet   HERNIA REPAIR     right leg fracture s/p repair         Home Medications    Prior to Admission medications   Medication Sig Start Date End Date Taking? Authorizing Provider  benzonatate (TESSALON) 100 MG capsule Take 1 capsule (100 mg total) by mouth 3 (three) times daily as needed for cough. 06/27/21  Yes Bradey Luzier, Britta Mccreedy, MD  cholecalciferol (VITAMIN D) 1000 units tablet Take 2,000 Units by mouth daily.    [provider]  fenofibrate 160 MG tablet Take 160 mg by mouth daily.    [provider]  hydrOXYzine (ATARAX/VISTARIL) 25 MG tablet Take 25 mg by mouth at bedtime.    [provider]  ibuprofen (ADVIL,MOTRIN) 200 MG tablet Take 400 mg by mouth every 6 (six) hours as needed for headache (or pain).    [provider]  meclizine (ANTIVERT) 25 MG tablet Take 1 tablet (25 mg total) by mouth 3  (three) times daily as needed for dizziness. 12/12/16   Noni Saupe, MD  omeprazole (PRILOSEC) 40 MG capsule Take 40 mg by mouth daily.    [provider]  Propylene Glycol (SYSTANE BALANCE) 0.6 % SOLN Place 1-2 drops into both eyes daily as needed (for itching).    [provider]    Family History Family History  Problem Relation Age of Onset   Mental illness Mother    Heart disease Mother    Hyperlipidemia Mother    Heart disease Father    Hyperlipidemia Sister     Social History Social History   Tobacco Use   Smoking status: Never   Smokeless tobacco: Never  Vaping Use   Vaping Use: Never used  Substance Use Topics   Alcohol use: No   Drug use: No     Allergies   Patient has no known allergies.   Review of Systems Review of Systems  HENT: Negative.    Respiratory:  Positive for cough. Negative for chest tightness, shortness of breath and wheezing.   Genitourinary: Negative.   Neurological: Negative.      Physical Exam Triage Vital Signs ED Triage Vitals [06/27/21 1255]  Enc Vitals Group     BP 117/83     Pulse Rate 100     Resp 18     Temp 98 F (36.7 C)     Temp Source Oral     SpO2 96 %     Weight      Height      Head Circumference      Peak Flow      Pain Score 0     Pain Loc      Pain Edu?      Excl. in GC?    No data found.  Updated Vital Signs BP 117/83 (BP Location: Left Arm)   Pulse 100   Temp 98 F (36.7 C) (Oral)   Resp 18   SpO2 96%   Visual Acuity Right Eye Distance:   Left Eye Distance:   Bilateral Distance:    Right Eye Near:   Left Eye Near:    Bilateral Near:     Physical Exam Vitals and nursing note reviewed.  Constitutional:      Appearance: Normal appearance.  HENT:     Right Ear: Tympanic membrane normal.     Left Ear: Tympanic membrane normal.     Mouth/Throat:     Mouth: Mucous membranes are moist.     Pharynx: Posterior oropharyngeal erythema present.  Eyes:      Conjunctiva/sclera: Conjunctivae normal.     Pupils: Pupils are equal, round, and reactive to light.  Cardiovascular:     Rate and Rhythm: Normal rate and regular rhythm.     Pulses: Normal pulses.     Heart sounds: Normal heart sounds.  Pulmonary:     Effort: Pulmonary effort is normal.     Breath sounds: Normal breath sounds. No wheezing or rhonchi.  Abdominal:     General: Bowel sounds are normal.     Palpations: Abdomen is soft.  Neurological:     Mental Status: He is alert.      UC Treatments / Results  Labs (all labs ordered are listed, but only abnormal results are displayed) Labs Reviewed - No data to display  EKG   Radiology No results found.  Procedures Procedures (including critical care time)  Medications Ordered in UC Medications - No data to display  Initial Impression / Assessment and Plan / UC Course  I have reviewed the triage vital signs and the nursing notes.  Pertinent labs & imaging results that were available during my care of the patient were reviewed by me and considered in my medical decision making (see chart for details).     1.  Acute cough, this is likely environmental/allergic related Less likely to be a viral infection Tessalon Perles as needed for cough Lung exam is unremarkable Return precautions given Maintain adequate hydration Tylenol/Motrin as needed for pain and/or fever. Final Clinical Impressions(s) / UC Diagnoses   Final diagnoses:  Acute cough     Discharge Instructions      Take medications as prescribed Humidifier use will be helpful in reducing cough Maintain adequate hydration No indication for COVID testing Return to urgent care if you have any other concerns.   ED Prescriptions     Medication Sig Dispense Auth. Provider   benzonatate (TESSALON) 100 MG capsule Take 1 capsule (100 mg total) by mouth 3 (three) times daily as needed for cough. 30 capsule Brodie Scovell, Britta Mccreedy, MD      PDMP not reviewed  this encounter.   Merrilee Jansky,  MD 06/27/21 1324

## 2021-06-27 NOTE — ED Triage Notes (Signed)
Pt c/o cough x 2-3 days.

## 2022-03-08 ENCOUNTER — Emergency Department (HOSPITAL_COMMUNITY)
Admission: EM | Admit: 2022-03-08 | Discharge: 2022-03-08 | Disposition: A | Discharge status: Other Acute Inpatient Hospital | Payer: Managed Care, Other (non HMO) | Attending: Emergency Medicine | Admitting: Emergency Medicine

## 2022-03-08 ENCOUNTER — Ambulatory Visit (HOSPITAL_COMMUNITY)
Admission: EM | Admit: 2022-03-08 | Discharge: 2022-03-09 | Disposition: A | Discharge status: Home / Self Care | Payer: No Payment, Other | Attending: Family | Admitting: Family

## 2022-03-08 ENCOUNTER — Encounter (HOSPITAL_COMMUNITY): Payer: Self-pay | Admitting: Psychiatry

## 2022-03-08 DIAGNOSIS — R45851 Suicidal ideations: Secondary | ICD-10-CM

## 2022-03-08 DIAGNOSIS — F32A Depression, unspecified: Secondary | ICD-10-CM | POA: Insufficient documentation

## 2022-03-08 DIAGNOSIS — Z1152 Encounter for screening for COVID-19: Secondary | ICD-10-CM | POA: Diagnosis not present

## 2022-03-08 DIAGNOSIS — F418 Other specified anxiety disorders: Secondary | ICD-10-CM | POA: Insufficient documentation

## 2022-03-08 DIAGNOSIS — Z79899 Other long term (current) drug therapy: Secondary | ICD-10-CM | POA: Insufficient documentation

## 2022-03-08 LAB — CBC WITH DIFFERENTIAL/PLATELET
Abs Immature Granulocytes: 0.01 10*3/uL (ref 0.00–0.07)
Basophils Absolute: 0.1 10*3/uL (ref 0.0–0.1)
Basophils Relative: 1 %
Eosinophils Absolute: 0.1 10*3/uL (ref 0.0–0.5)
Eosinophils Relative: 2 %
HCT: 48.1 % (ref 39.0–52.0)
Hemoglobin: 16.3 g/dL (ref 13.0–17.0)
Immature Granulocytes: 0 %
Lymphocytes Relative: 27 %
Lymphs Abs: 1.8 10*3/uL (ref 0.7–4.0)
MCH: 31.3 pg (ref 26.0–34.0)
MCHC: 33.9 g/dL (ref 30.0–36.0)
MCV: 92.3 fL (ref 80.0–100.0)
Monocytes Absolute: 0.7 10*3/uL (ref 0.1–1.0)
Monocytes Relative: 10 %
Neutro Abs: 4.2 10*3/uL (ref 1.7–7.7)
Neutrophils Relative %: 60 %
Platelets: 308 10*3/uL (ref 150–400)
RBC: 5.21 MIL/uL (ref 4.22–5.81)
RDW: 12.5 % (ref 11.5–15.5)
WBC: 6.9 10*3/uL (ref 4.0–10.5)
nRBC: 0 % (ref 0.0–0.2)

## 2022-03-08 LAB — ETHANOL: Alcohol, Ethyl (B): <10 mg/dL (ref <10)

## 2022-03-08 LAB — RESP PANEL BY RT-PCR (RSV, FLU A&B, COVID)  RVPGX2
Influenza A by PCR: NEGATIVE
Influenza B by PCR: NEGATIVE
Resp Syncytial Virus by PCR: NEGATIVE
SARS Coronavirus 2 by RT PCR: NEGATIVE

## 2022-03-08 LAB — COMPREHENSIVE METABOLIC PANEL
ALT: 46 U/L — ABNORMAL HIGH (ref 0–44)
AST: 30 U/L (ref 15–41)
Albumin: 4.5 g/dL (ref 3.5–5.0)
Alkaline Phosphatase: 44 U/L (ref 38–126)
Anion gap: 10 (ref 5–15)
BUN: 14 mg/dL (ref 6–20)
CO2: 25 mmol/L (ref 22–32)
Calcium: 9.6 mg/dL (ref 8.9–10.3)
Chloride: 103 mmol/L (ref 98–111)
Creatinine, Ser: 1.03 mg/dL (ref 0.61–1.24)
GFR, Estimated: >60 mL/min (ref >60)
Glucose, Bld: 91 mg/dL (ref 70–99)
Potassium: 3.5 mmol/L (ref 3.5–5.1)
Sodium: 138 mmol/L (ref 135–145)
Total Bilirubin: 0.8 mg/dL (ref 0.3–1.2)
Total Protein: 7.4 g/dL (ref 6.5–8.1)

## 2022-03-08 LAB — POCT URINE DRUG SCREEN - MANUAL ENTRY (I-SCREEN)
POC Amphetamine UR: NOT DETECTED
POC Buprenorphine (BUP): NOT DETECTED
POC Cocaine UR: NOT DETECTED
POC Marijuana UR: NOT DETECTED
POC Methadone UR: NOT DETECTED
POC Methamphetamine UR: NOT DETECTED
POC Morphine: NOT DETECTED
POC Oxazepam (BZO): NOT DETECTED
POC Oxycodone UR: NOT DETECTED
POC Secobarbital (BAR): NOT DETECTED

## 2022-03-08 LAB — TSH: TSH: 5.481 u[IU]/mL — ABNORMAL HIGH (ref 0.350–4.500)

## 2022-03-08 LAB — HEMOGLOBIN A1C
Hgb A1c MFr Bld: 5.7 % — ABNORMAL HIGH (ref 4.8–5.6)
Mean Plasma Glucose: 116.89 mg/dL

## 2022-03-08 LAB — POC SARS CORONAVIRUS 2 AG: SARSCOV2ONAVIRUS 2 AG: NEGATIVE

## 2022-03-08 LAB — MAGNESIUM: Magnesium: 2 mg/dL (ref 1.7–2.4)

## 2022-03-08 MED ORDER — HYDROXYZINE HCL 25 MG PO TABS
25.0000 mg | ORAL_TABLET | Freq: Three times a day (TID) | ORAL | Status: DC | PRN
  Administered 2022-03-08: 25 mg via ORAL
  Filled 2022-03-08: qty 1

## 2022-03-08 MED ORDER — MAGNESIUM HYDROXIDE 400 MG/5ML PO SUSP: 30.0000 mL | Freq: Every day | ORAL | Status: DC | PRN

## 2022-03-08 MED ORDER — ESCITALOPRAM OXALATE 10 MG PO TABS
10.0000 mg | ORAL_TABLET | Freq: Every day | ORAL | Status: DC
  Administered 2022-03-08: 10 mg via ORAL
  Filled 2022-03-08: qty 1

## 2022-03-08 MED ORDER — PANTOPRAZOLE SODIUM 40 MG PO TBEC: 80.0000 mg | DELAYED_RELEASE_TABLET | Freq: Every day | ORAL | Status: DC

## 2022-03-08 MED ORDER — FENOFIBRATE 160 MG PO TABS: 160.0000 mg | ORAL_TABLET | Freq: Every day | ORAL | Status: DC

## 2022-03-08 MED ORDER — ALUM & MAG HYDROXIDE-SIMETH 200-200-20 MG/5ML PO SUSP: 30.0000 mL | ORAL | Status: DC | PRN

## 2022-03-08 MED ORDER — TRAZODONE HCL 50 MG PO TABS
50.0000 mg | ORAL_TABLET | Freq: Every evening | ORAL | Status: DC | PRN
  Administered 2022-03-08: 50 mg via ORAL
  Filled 2022-03-08: qty 1

## 2022-03-08 MED ORDER — ACETAMINOPHEN 325 MG PO TABS
650.0000 mg | ORAL_TABLET | Freq: Four times a day (QID) | ORAL | Status: DC | PRN
  Administered 2022-03-08: 650 mg via ORAL
  Filled 2022-03-08: qty 2

## 2022-03-08 NOTE — ED Notes (Signed)
Patient admitted to obs.  Denies SI/HI/AVH. Calm, cooperative throughout interview process. Skin assessment completed. Oriented to unit. Meal and drink offered. At [place], pt continue to denies SI/HI/AVH. Pt verbally contract for safety. Will monitor for safety.

## 2022-03-08 NOTE — ED Notes (Signed)
Pt admitted to OBS after endorsing making a suicidal statement at work this morning. Patient denies being suicidal currently as that statement was due to frustrations he was going through on his finances. Pt denies SI, HI, AVH,  and opioid  abuse. Patient A lert and oriented x 4 and was cooperative during the admission assessment. Skin assessment complete. Belongings inventoried. Patient oriented to unit and unit rules. Meal and drinks offered to patient.  Patient verbalized agreement to treatment plans. Patient verbally contracts for safety while hospitalized. Will monitor for safety.

## 2022-03-08 NOTE — ED Provider Notes (Signed)
[facility] Pacheco EMERGENCY DEPARTMENT AT [facility] Provider Note   CSN: [id number] Arrival date & time: 03/08/22  1344     History  Chief Complaint  Patient presents with   Suicidal         John Pacheco is a 52 y.o. male with PMH significant for anxiety, depression who presents with concern for new thoughts of harming himself secondary to financial stress, life stress beginning today.  He denies any plan for suicide, has not had any previous suicide attempts.  He reports he takes alprazolam for anxiety with no significant relief.  Patient reports he can contract for safety at this time, and is very cooperative during my assessment.  HPI     Home Medications Prior to Admission medications   Medication Sig Start Date End Date Taking? Authorizing Provider  benzonatate (TESSALON) 100 MG capsule Take 1 capsule (100 mg total) by mouth 3 (three) times daily as needed for cough. 06/27/21   LampteyBritta Mccreedy, MD  cholecalciferol (VITAMIN D) 1000 units tablet Take 2,000 Units by mouth daily.    [provider]  fenofibrate 160 MG tablet Take 160 mg by mouth daily.    [provider]  hydrOXYzine (ATARAX/VISTARIL) 25 MG tablet Take 25 mg by mouth at bedtime.    [provider]  ibuprofen (ADVIL,MOTRIN) 200 MG tablet Take 400 mg by mouth every 6 (six) hours as needed for headache (or pain).    [provider]  meclizine (ANTIVERT) 25 MG tablet Take 1 tablet (25 mg total) by mouth 3 (three) times daily as needed for dizziness. 12/12/16   Noni Saupe, MD  omeprazole (PRILOSEC) 40 MG capsule Take 40 mg by mouth daily.    [provider]  Propylene Glycol (SYSTANE BALANCE) 0.6 % SOLN Place 1-2 drops into both eyes daily as needed (for itching).    [provider]      Allergies    Patient has no known allergies.    Review of Systems   Review of Systems  Psychiatric/Behavioral:  Positive for suicidal ideas.   All other  systems reviewed and are negative.   Physical Exam Updated Vital Signs BP 133/79   Pulse 72   Temp 97.8 F (36.6 C)   Resp 16   Ht 5\' 9"  (1.753 m)   Wt 81.6 kg   SpO2 96%   BMI 26.58 kg/m  Physical Exam Vitals and nursing note reviewed.  Constitutional:      General: He is not in acute distress.    Appearance: Normal appearance.  HENT:     Head: Normocephalic and atraumatic.  Eyes:     General:        Right eye: No discharge.        Left eye: No discharge.  Cardiovascular:     Rate and Rhythm: Normal rate and regular rhythm.  Pulmonary:     Effort: Pulmonary effort is normal. No respiratory distress.  Musculoskeletal:        General: No deformity.  Skin:    General: Skin is warm and dry.  Neurological:     Mental Status: He is alert and oriented to person, place, and time.  Psychiatric:        Mood and Affect: Mood normal.        Behavior: Behavior normal.     Comments: endorses SI without HI, AVH, no plan for [facility]     ED Results / Procedures / Treatments  Labs (all labs ordered are listed, but only abnormal results are displayed) Labs Reviewed - No data to display  EKG None  Radiology No results found.  Procedures Procedures    Medications Ordered in ED Medications - No data to display  ED Course/ Medical Decision Making/ A&P                             Medical Decision Making  Patient is a 52 y.o. male  who presents to the emergency department for psychiatric complaint.  Past Medical History: Anxiety, depression  Physical Exam: Patient with overall normal thought content other than SI during his evaluation, he is able to respond calmly and easily to questions, denies active plan for SI.  Disposition: Patient is otherwise medically cleared at this time, and does not have active plan at this time, I spoke with behavioral health urgent care provider Doran Heater, NP who accepts patient in transfer.  We will transport patient via safe transport  given his suicidal ideation at this time.  I do not think he needs any other medical attention at this time other than an urgent psychiatric evaluation.   Final Clinical Impression(s) / ED Diagnoses Final diagnoses:  Suicidal ideation    Rx / DC Orders ED Discharge Orders     None         [place] 03/08/22 1524    Terrilee Files, MD 03/09/22 1031

## 2022-03-08 NOTE — ED Notes (Signed)
[facility] lab courier called to transport labs to [facility] lab

## 2022-03-08 NOTE — ED Triage Notes (Signed)
Pt to ED c/o increased anxiety and stress, resulting in suicidal thoughts today. Pt coming from work. Reports finical stressors. Able to contract for safety.

## 2022-03-08 NOTE — ED Notes (Signed)
Attempted to call nursing report to [facility], No answer, informed by ED PA, pt has been accepted to [facility].

## 2022-03-08 NOTE — ED Provider Triage Note (Signed)
Emergency Medicine Provider Triage Evaluation Note  John Pacheco , a 52 y.o. male  was evaluated in triage.  Pt complains of thoughts of ending his life starting today with history of anxiety, depression.  Patient denies any active plan.  He denies any previous history of overdose.  Patient reports that he takes alprazolam daily with no relief of his symptoms.  Patient reports his major stressor is financial.  Patient denies any previous history of suicide attempts.  He denies any alcohol, or recreational drug use.  Review of Systems  Positive: Suicidal ideation, anxiety, depression Negative: Active plan, suicide attempt, HI, AVH  Physical Exam  BP 133/79 (BP Location: Right Arm)   Pulse 72   Temp 97.8 F (36.6 C) (Oral)   Resp 16   Ht '5\' 9"'$  (1.753 m)   Wt 81.6 kg   SpO2 96%   BMI 26.58 kg/m  Gen:   Awake, no distress   Resp:  Normal effort  MSK:   Moves extremities without difficulty  Other:    Medical Decision Making  Medically screening exam initiated at 2:19 PM.  Appropriate orders placed.  John Pacheco was informed that the remainder of the evaluation will be completed by another provider, this initial triage assessment does not replace that evaluation, and the importance of remaining in the ED until their evaluation is complete.  Spoke with Beatriz Stallion with behavioral health urgent care who accepted patient in transfer, patient will be sent to [facility] via safe transport   Kahmya Pinkham H, PA-C 03/08/22 1420

## 2022-03-08 NOTE — ED Notes (Signed)
[facility] here to transport pt to [facility] per EDP order.

## 2022-03-08 NOTE — ED Provider Notes (Signed)
[facility] Continuous Assessment Admission H&P  Date: 03/08/22 Patient Name: John Pacheco MRN: [id number] Chief Complaint:   Diagnoses:  Final diagnoses:  Passive suicidal ideations    HPI: Patient presents voluntarily to [facility] behavioral health, transferred from [facility] emergency department. Patient is assessed, face-to-face, by nurse practitioner.  He is seated in assessment area, no acute distress. Consulted with provider, Dr.  Leverne Humbles, and chart reviewed on 03/08/2022. He  is alert and oriented, pleasant and cooperative during assessment.  Patient presents with depressed mood, tearful affect.  Patient shares that he is in [place] and he made a vague suicidal statement at work this morning.  Patient reports he is facing significant financial stressors at home including his wife's car being repossessed last week and facing foreclosure on his home.  Patient was not forthcoming with his wife regarding the state of finances and told lies about several loans and credit cards.  Patient's mother and father-in-law are currently assisting with finances.  Patient reports his father-in-law stated to him "you need to grow up."  Patient reports he felt "ganged up on by 3 different people."  He received a text this morning, while at work from his father-in-law that he "needed to grow up."  Patient then reports making a vague suicidal statement.  Patient endorses feeling frustrated.  He denies any plan or intent to complete suicide.  Patient reports he did make a previous vague suicidal statement 1 week ago when his wife's car was repossessed. He denies suicidal and homicidal ideations. Denies history of suicide attempts, denies history of nonsuicidal self-harm behavior.  Patient contracts verbally for safety with this Probation officer.    Patient has normal speech and behavior.  He  denies auditory and visual hallucinations.  Patient is able to converse coherently with goal-directed thoughts  and no distractibility or preoccupation.  Denies symptoms of paranoia.  Objectively there is no evidence of psychosis/mania or delusional thinking.  Patient endorses mental health history including ADD and reports he has "a learning disability."  He has been prescribed Lexapro, several months ago, by primary care provider. Reports feeling anxious intermittently.  He has been compliant with medication.  He is not linked with outpatient psychiatry currently.  He was seen by counseling approximately 3 years ago, discontinued related to inability to afford counseling sessions.  Has plans to return to individual counseling.  He denies history of inpatient psychiatric hospitalization.  No family mental health history reported.  [name] resides in [place] with his wife and 43-year-old son.  He denies access to weapons.  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  He denies alcohol and substance use.  Patient endorses average sleep and appetite.  Patient offered support and encouragement.  He gives verbal consent to speak with his wife,[name], phone number [phone].  Spoke with patient's wife, [name], who confirms patient has no access to weapons to her knowledge.  She denies safety concerns and agrees with plan for reevaluation tomorrow and potential discharge.  Reviewed treatment plan with patient to include overnight observation with reassessment on 03/09/2022.  Discussed medications including trazodone and hydroxyzine, reviewed side effects and patient offered opportunity to ask questions.  Patient verbalizes desire to remain full CODE STATUS.   Total Time spent with patient: 45 minutes  Musculoskeletal  Strength & Muscle Tone: within normal limits Gait & Station: normal Patient leans: N/A  Psychiatric Specialty Exam  Presentation General Appearance:  Appropriate for Environment; Casual  Eye Contact: Good  Speech:  Clear and Coherent; Normal Rate  Speech  Volume: Normal  Handedness: Right   Mood and Affect  Mood: Depressed  Affect: Depressed; Tearful   Thought Process  Thought Processes: Coherent; Goal Directed; Linear  Descriptions of Associations:Intact  Orientation:Full (Time, Place and Person)  Thought Content:Logical; WDL    Hallucinations:Hallucinations: None  Ideas of Reference:None  Suicidal Thoughts:Suicidal Thoughts: Yes, Passive SI Passive Intent and/or Plan: Without Intent; Without Plan  Homicidal Thoughts:Homicidal Thoughts: No   Sensorium  Memory: Immediate Good; Recent Good  Judgment: Good  Insight: Fair   Executive Functions  Concentration: Good  Attention Span: Good  Recall: Good  Fund of Knowledge: Fair  Language: Fair   Psychomotor Activity  Psychomotor Activity: Psychomotor Activity: Normal   Assets  Assets: Communication Skills; Desire for Improvement; Leisure Time; Physical Health; Resilience; Social Support   Sleep  Sleep: Sleep: Good   Nutritional Assessment (For OBS and FBC admissions only) Has the patient had a weight loss or gain of 10 pounds or more in the last 3 months?: No Has the patient had a decrease in food intake/or appetite?: No Does the patient have dental problems?: No Does the patient have eating habits or behaviors that may be indicators of an eating disorder including binging or inducing vomiting?: No Has the patient recently lost weight without trying?: 0 Has the patient been eating poorly because of a decreased appetite?: 0 Malnutrition Screening Tool Score: 0    Physical Exam Vitals and nursing note reviewed.  Constitutional:      Appearance: Normal appearance. He is well-developed and normal weight.  HENT:     Head: Normocephalic.     Nose: Nose normal.  Cardiovascular:     Rate and Rhythm: Normal rate.  Pulmonary:     Effort: Pulmonary effort is normal.  Musculoskeletal:     Cervical back: Normal range of motion.  Skin:     General: Skin is warm and dry.  Neurological:     Mental Status: He is alert and oriented to person, place, and time.  Psychiatric:        Attention and Perception: Attention and perception normal.        Mood and Affect: Mood is depressed. Affect is tearful.        Speech: Speech normal.        Behavior: Behavior normal. Behavior is cooperative.        Thought Content: Thought content normal.        Cognition and Memory: Cognition and memory normal.    Review of Systems  Constitutional: Negative.   HENT: Negative.    Eyes: Negative.   Respiratory: Negative.    Cardiovascular: Negative.   Gastrointestinal: Negative.   Genitourinary: Negative.   Musculoskeletal: Negative.   Skin: Negative.   Neurological: Negative.   Psychiatric/Behavioral:  Positive for depression.     Blood pressure 122/71, pulse 70, temperature 98.2 F (36.8 C), temperature source Oral, resp. rate 16, height '5\' 9"'$  (1.753 m), weight 177 lb (80.3 kg), SpO2 99 %. Body mass index is 26.14 kg/m.  Past Psychiatric History: Per report ADD  Is the patient at risk to self? No  Has the patient been a risk to self in the past 6 months? No .    Has the patient been a risk to self within the distant past? No   Is the patient a risk to others? No   Has the patient been a risk to others in the past 6 months? No  Has the patient been a risk to others within the distant past? No   Past Medical History: Left knee pain  Family History: None reported  Social History: Married, resides with wife and son  Last Labs:  No visits with results within 6 Month(s) from this visit.  Latest known visit with results is:  Office Visit on 12/12/2016  Component Date Value Ref Range Status   Influenza A, POC 12/12/2016 Negative  Negative Final   Influenza B, POC 12/12/2016 Negative  Negative Final   Rapid Strep A Screen 12/12/2016 Negative  Negative Final   WBC 12/12/2016 13.1 (A)  4.6 - 10.2 K/uL Final   Lymph, poc 12/12/2016  1.0  0.6 - 3.4 Final   POC LYMPH PERCENT 12/12/2016 7.6 (A)  10 - 50 %L Final   MID (cbc) 12/12/2016 0.4  0 - 0.9 Final   POC MID % 12/12/2016 3.3  0 - 12 %M Final   POC Granulocyte 12/12/2016 11.7 (A)  2 - 6.9 Final   Granulocyte percent 12/12/2016 89.1 (A)  37 - 80 %G Final   RBC 12/12/2016 5.22  4.69 - 6.13 M/uL Final   Hemoglobin 12/12/2016 16.3  14.1 - 18.1 g/dL Final   HCT, POC 12/12/2016 48.0  43.5 - 53.7 % Final   MCV 12/12/2016 92.0  80 - 97 fL Final   MCH, POC 12/12/2016 31.1  27 - 31.2 pg Final   MCHC 12/12/2016 34.1  31.8 - 35.4 g/dL Final   RDW, POC 12/12/2016 13.2  % Final   Platelet Count, POC 12/12/2016 346  142 - 424 K/uL Final   MPV 12/12/2016 7.4  0 - 99.8 fL Final   Strep A Culture 12/12/2016 Negative   Final    Allergies: Patient has no known allergies.  Medications:  Facility Ordered Medications  Medication   acetaminophen (TYLENOL) tablet 650 mg   alum & mag hydroxide-simeth (MAALOX/MYLANTA) 200-200-20 MG/5ML suspension 30 mL   magnesium hydroxide (MILK OF MAGNESIA) suspension 30 mL   hydrOXYzine (ATARAX) tablet 25 mg   traZODone (DESYREL) tablet 50 mg   escitalopram (LEXAPRO) tablet 10 mg   [START ON 03/09/2022] fenofibrate tablet 160 mg   [START ON 03/09/2022] pantoprazole (PROTONIX) EC tablet 80 mg   PTA Medications  Medication Sig   omeprazole (PRILOSEC) 40 MG capsule Take 40 mg by mouth daily.   fenofibrate 160 MG tablet Take 160 mg by mouth daily.   cholecalciferol (VITAMIN D) 1000 units tablet Take 2,000 Units by mouth daily.   Propylene Glycol (SYSTANE BALANCE) 0.6 % SOLN Place 1-2 drops into both eyes daily as needed (for itching).   ibuprofen (ADVIL,MOTRIN) 200 MG tablet Take 400 mg by mouth every 6 (six) hours as needed for headache (or pain).   escitalopram (LEXAPRO) 10 MG tablet Take 10 mg by mouth at bedtime.    Medical Decision Making  Patient remains voluntary.  He will be admitted to observation unit for treatment and stabilization.  He  will be reassessed on 03/09/2022, disposition will be determined at that time.  Laboratory studies ordered including CBC, CMP, ethanol, A1c, magnesium and TSH.  Urine drug screen order initiated.  EKG ordered.  Current medications: -Acetaminophen 650 mg every 6 as needed/mild pain -Maalox 30 mL oral every 4 as needed/digestion -Hydroxyzine 25 mg 3 times daily as needed/anxiety -Magnesium hydroxide 30 mL daily as needed/mild constipation -Trazodone 50 mg nightly as needed/sleep   Home medications: -Escitalopram 10 mg daily -Fenofibrate 160 mg daily -Pantoprazole 80 mg  daily  Recommendations  Based on my evaluation the patient does not appear to have an emergency medical condition.  Lucky Rathke, FNP 03/08/22  6:28 PM

## 2022-03-09 ENCOUNTER — Encounter (HOSPITAL_COMMUNITY): Payer: Self-pay | Admitting: Psychiatry

## 2022-03-09 DIAGNOSIS — F4323 Adjustment disorder with mixed anxiety and depressed mood: Secondary | ICD-10-CM | POA: Insufficient documentation

## 2022-03-09 MED ORDER — TRAZODONE HCL 50 MG PO TABS: 50.0000 mg | ORAL_TABLET | Freq: Every evening | ORAL | 0 refills | Status: AC | PRN

## 2022-03-09 NOTE — Discharge Instructions (Signed)
Patient is instructed prior to discharge to:  Take all medications as prescribed by his/her mental healthcare provider. Report any adverse effects and or reactions from the medicines to his/her outpatient provider promptly. Keep all scheduled appointments, to ensure that you are getting refills on time and to avoid any interruption in your medication.  If you are unable to keep an appointment call to reschedule.  Be sure to follow-up with resources and follow-up appointments provided.  Patient has been instructed & cautioned: To not engage in alcohol and or illegal drug use while on prescription medicines. In the event of worsening symptoms, patient is instructed to call the crisis hotline, 911 and or go to the nearest ED for appropriate evaluation and treatment of symptoms. To follow-up with his/her primary care provider for your other medical issues, concerns and or health care needs.  Information: -National Suicide Prevention Lifeline 1-800-SUICIDE or 903 741 4186.  -988 offers 24/7 access to trained crisis counselors who can help people experiencing mental health-related distress. People can call or text 988 or chat 988lifeline.org for themselves or if they are worried about a loved one who may need crisis support.

## 2022-03-09 NOTE — ED Notes (Signed)
Assumed care of pt at 0730. Pt pleasant and cooperative, states he is feeling better, '' well all that 's really passed. I'm not suicidal and I feel fine. I was wondering when the doctor will come to release me?'' Pt denies any SI HI or AV Hallucinations. Pt has been resting quietly. Pt given breakfast of juice and nutrigrain. Pt is safe.

## 2022-03-09 NOTE — ED Notes (Signed)
Pt is in the bed sleeping. Respirations are even and unlabored. No acute distress noted. Will continue to monitor for safety. 

## 2022-03-09 NOTE — ED Provider Notes (Signed)
FBC/OBS ASAP Discharge Summary  Date and Time: 03/09/2022 8:31 AM  Name: John Pacheco  MRN:  [id number]   Discharge Diagnoses:  Final diagnoses:  Passive suicidal ideations    Subjective: Patient states "I am ready to get home, I am ready to see my wife and my son."  Patient is reassessed, face-to-face, by nurse practitioner.  He is reclined in observation area, no apparent distress.  He is alert and oriented, pleasant and cooperative during assessment.  Patient presents with euthymic mood, congruent affect.  Patient reports readiness to discharge home, birthday party for his son later this date.  John Pacheco is insightful regarding situation leading to his admission.  He reports he would like to be followed by outpatient counseling.  He is waiting for healthcare insurance through his employer.  Reviewed outpatient resources available at [facility] behavioral health while uninsured.  Patient denies suicidal and homicidal ideations.  He denies auditory and visual hallucinations.  There is no evidence of delusional thought content and no indication that patient is responding to internal stimuli.  He denies symptoms of paranoia. John Pacheco endorses average sleep and appetite.  Patient offered support and encouragement.  He gives verbal consent to speak with his wife prior to discharge.  Spoke with John Pacheco [phone]. She continues to deny safety concerns and verbalizes understanding of strict return precautions.   Patient and family are educated and verbalize understanding of mental health resources and other crisis services in the community. They are instructed to call 911 and present to the nearest emergency room should patient experience any suicidal/homicidal ideation, auditory/visual/hallucinations, or detrimental worsening of mental health condition.     Stay Summary: HPI 03/08/2022-1745pm: Patient presents voluntarily to [facility] behavioral health, transferred from [facility] emergency  department. Patient is assessed, face-to-face, by nurse practitioner.  He is seated in assessment area, no acute distress. Consulted with provider, Dr.  Leverne Humbles, and chart reviewed on 03/08/2022. He  is alert and oriented, pleasant and cooperative during assessment.  Patient presents with depressed mood, tearful affect.   Patient shares that he is in [place] and he made a vague suicidal statement at work this morning.  Patient reports he is facing significant financial stressors at home including his wife's car being repossessed last week and facing foreclosure on his home.  Patient was not forthcoming with his wife regarding the state of finances and told lies about several loans and credit cards.  Patient's mother and father-in-law are currently assisting with finances.  Patient reports his father-in-law stated to him "you need to grow up."  Patient reports he felt "ganged up on by 3 different people."  He received a text this morning, while at work from his father-in-law that he "needed to grow up."  Patient then reports making a vague suicidal statement.  Patient endorses feeling frustrated.  He denies any plan or intent to complete suicide.  Patient reports he did make a previous vague suicidal statement 1 week ago when his wife's car was repossessed. He denies suicidal and homicidal ideations. Denies history of suicide attempts, denies history of nonsuicidal self-harm behavior.  Patient contracts verbally for safety with this Probation officer.    Patient has normal speech and behavior.  He  denies auditory and visual hallucinations.  Patient is able to converse coherently with goal-directed thoughts and no distractibility or preoccupation.  Denies symptoms of paranoia.  Objectively there is no evidence of psychosis/mania or delusional thinking.   Patient endorses mental health history including ADD  and reports he has "a learning disability."  He has been prescribed Lexapro, several months ago, by  primary care provider. Reports feeling anxious intermittently.  He has been compliant with medication.  He is not linked with outpatient psychiatry currently.  He was seen by counseling approximately 3 years ago, discontinued related to inability to afford counseling sessions.  Has plans to return to individual counseling.  He denies history of inpatient psychiatric hospitalization.  No family mental health history reported.   John Pacheco resides in [place] with his wife and 42-year-old son.  He denies access to weapons.  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  He denies alcohol and substance use.  Patient endorses average sleep and appetite.   Patient offered support and encouragement.  He gives verbal consent to speak with his wife,John Pacheco, phone number [phone].   Spoke with patient's wife, John Pacheco, who confirms patient has no access to weapons to her knowledge.  She denies safety concerns and agrees with plan for reevaluation tomorrow and potential discharge.   Reviewed treatment plan with patient to include overnight observation with reassessment on 03/09/2022.  Discussed medications including trazodone and hydroxyzine, reviewed side effects and patient offered opportunity to ask questions.  Patient verbalizes desire to remain full CODE STATUS.    Total Time spent with patient: 20 minutes  Past Psychiatric History: see H&P Past Medical History: see H&P Family History: none reported Family Psychiatric History: none reported Social History: see H&P Tobacco Cessation:  N/A, patient does not currently use tobacco products  Current Medications:  Current Facility-Administered Medications  Medication Dose Route Frequency Provider Last Rate Last Admin   acetaminophen (TYLENOL) tablet 650 mg  650 mg Oral Q6H PRN Lucky Rathke, FNP   650 mg at 03/08/22 1947   alum & mag hydroxide-simeth (MAALOX/MYLANTA) 200-200-20 MG/5ML suspension 30 mL  30 mL Oral Q4H PRN Lucky Rathke, FNP        escitalopram (LEXAPRO) tablet 10 mg  10 mg Oral QHS Lucky Rathke, FNP   10 mg at 03/08/22 2118   fenofibrate tablet 160 mg  160 mg Oral Daily Lucky Rathke, FNP       hydrOXYzine (ATARAX) tablet 25 mg  25 mg Oral TID PRN Lucky Rathke, FNP   25 mg at 03/08/22 2119   magnesium hydroxide (MILK OF MAGNESIA) suspension 30 mL  30 mL Oral Daily PRN Lucky Rathke, FNP       pantoprazole (PROTONIX) EC tablet 80 mg  80 mg Oral Daily Lucky Rathke, FNP       traZODone (DESYREL) tablet 50 mg  50 mg Oral QHS PRN Lucky Rathke, FNP   50 mg at 03/08/22 2119   Current Outpatient Medications  Medication Sig Dispense Refill   cholecalciferol (VITAMIN D) 1000 units tablet Take 2,000 Units by mouth daily.     escitalopram (LEXAPRO) 10 MG tablet Take 10 mg by mouth at bedtime.     fenofibrate 160 MG tablet Take 160 mg by mouth daily.     ibuprofen (ADVIL,MOTRIN) 200 MG tablet Take 400 mg by mouth every 6 (six) hours as needed for headache (or pain).     omeprazole (PRILOSEC) 40 MG capsule Take 40 mg by mouth daily.     Propylene Glycol (SYSTANE BALANCE) 0.6 % SOLN Place 1-2 drops into both eyes daily as needed (for itching).      PTA Medications:  Facility Ordered Medications  Medication   acetaminophen (TYLENOL) tablet 650 mg  alum & mag hydroxide-simeth (MAALOX/MYLANTA) 200-200-20 MG/5ML suspension 30 mL   magnesium hydroxide (MILK OF MAGNESIA) suspension 30 mL   hydrOXYzine (ATARAX) tablet 25 mg   traZODone (DESYREL) tablet 50 mg   escitalopram (LEXAPRO) tablet 10 mg   fenofibrate tablet 160 mg   pantoprazole (PROTONIX) EC tablet 80 mg   PTA Medications  Medication Sig   omeprazole (PRILOSEC) 40 MG capsule Take 40 mg by mouth daily.   fenofibrate 160 MG tablet Take 160 mg by mouth daily.   cholecalciferol (VITAMIN D) 1000 units tablet Take 2,000 Units by mouth daily.   Propylene Glycol (SYSTANE BALANCE) 0.6 % SOLN Place 1-2 drops into both eyes daily as needed (for itching).   ibuprofen  (ADVIL,MOTRIN) 200 MG tablet Take 400 mg by mouth every 6 (six) hours as needed for headache (or pain).       12/12/2016    9:34 AM 12/06/2016    3:47 PM 10/02/2016   10:51 AM  Depression screen PHQ 2/9  Decreased Interest 1 0 0  Down, Depressed, Hopeless 1 0 0  PHQ - 2 Score 2 0 0  Altered sleeping 2    Tired, decreased energy 3    Change in appetite 3    Feeling bad or failure about yourself  0    Trouble concentrating 0    Moving slowly or fidgety/restless 1    Suicidal thoughts 0    PHQ-9 Score 11      Flowsheet Row ED from 03/08/2022 in [facility] Most recent reading at 03/08/2022  7:55 PM ED from 03/08/2022 in [facility] Department at [facility] Most recent reading at 03/08/2022  2:07 PM ED from 06/27/2021 in [facility] Most recent reading at 06/27/2021 12:57 PM  C-SSRS RISK CATEGORY Error: Question 6 not populated Moderate Risk No Risk       Musculoskeletal  Strength & Muscle Tone: within normal limits Gait & Station: normal Patient leans: N/A  Psychiatric Specialty Exam  Presentation  General Appearance:  Appropriate for Environment; Casual  Eye Contact: Good  Speech: Clear and Coherent; Normal Rate  Speech Volume: Normal  Handedness: Right   Mood and Affect  Mood: Euthymic  Affect: Appropriate; Congruent   Thought Process  Thought Processes: Coherent; Goal Directed; Linear  Descriptions of Associations:Intact  Orientation:Full (Time, Place and Person)  Thought Content:Logical; WDL     Hallucinations:Hallucinations: None  Ideas of Reference:None  Suicidal Thoughts:Suicidal Thoughts: No SI Passive Intent and/or Plan: Without Intent; Without Plan  Homicidal Thoughts:Homicidal Thoughts: No   Sensorium  Memory: Immediate Good; Recent Good  Judgment: Good  Insight: Good   Executive Functions  Concentration: Good  Attention  Span: Good  Recall: Good  Fund of Knowledge: Good  Language: Good   Psychomotor Activity  Psychomotor Activity: Psychomotor Activity: Normal   Assets  Assets: Communication Skills; Desire for Improvement; Intimacy; Leisure Time; Physical Health; Social Support   Sleep  Sleep: Sleep: Good   Nutritional Assessment (For OBS and FBC admissions only) Has the patient had a weight loss or gain of 10 pounds or more in the last 3 months?: No Has the patient had a decrease in food intake/or appetite?: No Does the patient have dental problems?: No Does the patient have eating habits or behaviors that may be indicators of an eating disorder including binging or inducing vomiting?: No Has the patient recently lost weight without trying?: 0 Has the patient been eating  poorly because of a decreased appetite?: 0 Malnutrition Screening Tool Score: 0    Physical Exam  Physical Exam Vitals and nursing note reviewed.  Constitutional:      General: He is not in acute distress.    Appearance: Normal appearance. He is well-developed and normal weight.  HENT:     Head: Normocephalic and atraumatic.     Nose: Nose normal.  Cardiovascular:     Rate and Rhythm: Normal rate.     Heart sounds: No murmur heard. Pulmonary:     Effort: Pulmonary effort is normal. No respiratory distress.  Abdominal:     Tenderness: There is no abdominal tenderness.  Musculoskeletal:        General: No swelling.     Cervical back: Normal range of motion and neck supple.  Skin:    General: Skin is warm and dry.  Neurological:     Mental Status: He is alert and oriented to person, place, and time.  Psychiatric:        Attention and Perception: Attention and perception normal.        Mood and Affect: Mood and affect normal.        Speech: Speech normal.        Behavior: Behavior normal. Behavior is cooperative.        Thought Content: Thought content normal.        Cognition and Memory: Cognition and  memory normal.    Review of Systems  Constitutional: Negative.   HENT: Negative.    Eyes: Negative.   Respiratory: Negative.    Cardiovascular: Negative.   Gastrointestinal: Negative.   Genitourinary: Negative.   Musculoskeletal: Negative.   Skin: Negative.   Neurological: Negative.   Psychiatric/Behavioral: Negative.     Blood pressure (!) 93/52, pulse 62, temperature 97.6 F (36.4 C), temperature source Oral, resp. rate 20, height '5\' 9"'$  (1.753 m), weight 177 lb (80.3 kg), SpO2 99 %. Body mass index is 26.14 kg/m.  Demographic Factors:  Male and Caucasian  Loss Factors: NA  Historical Factors: NA  Risk Reduction Factors:   Responsible for children under 32 years of age, Sense of responsibility to family, Employed, Living with another person, especially a relative, Positive social support, Positive therapeutic relationship, and Positive coping skills or problem solving skills  Continued Clinical Symptoms:  Previous Psychiatric Diagnoses and Treatments  Cognitive Features That Contribute To Risk:  None    Suicide Risk:  Minimal: No identifiable suicidal ideation.  Patients presenting with no risk factors but with morbid ruminations; may be classified as minimal risk based on the severity of the depressive symptoms  Plan Of Care/Follow-up recommendations:  Follow-up with outpatient psychiatry.  Resources provided including [facility] behavioral health outpatient. Medications: -Escitalopram 10 mg nightly -trazodone 50 mg nightly as needed/sleep   Disposition: Discharge  Lucky Rathke, FNP 03/09/2022, 8:31 AM

## 2022-03-14 DIAGNOSIS — F411 Generalized anxiety disorder: Secondary | ICD-10-CM | POA: Diagnosis not present

## 2022-03-14 DIAGNOSIS — M25512 Pain in left shoulder: Secondary | ICD-10-CM | POA: Diagnosis not present

## 2022-03-14 DIAGNOSIS — Z599 Problem related to housing and economic circumstances, unspecified: Secondary | ICD-10-CM | POA: Diagnosis not present

## 2022-03-14 DIAGNOSIS — G4733 Obstructive sleep apnea (adult) (pediatric): Secondary | ICD-10-CM | POA: Diagnosis not present

## 2022-04-08 DIAGNOSIS — G4733 Obstructive sleep apnea (adult) (pediatric): Secondary | ICD-10-CM | POA: Diagnosis not present

## 2022-06-21 DIAGNOSIS — R7303 Prediabetes: Secondary | ICD-10-CM | POA: Diagnosis not present

## 2022-06-21 DIAGNOSIS — F4323 Adjustment disorder with mixed anxiety and depressed mood: Secondary | ICD-10-CM | POA: Diagnosis not present

## 2022-06-21 DIAGNOSIS — Z0000 Encounter for general adult medical examination without abnormal findings: Secondary | ICD-10-CM | POA: Diagnosis not present

## 2022-06-21 DIAGNOSIS — R1013 Epigastric pain: Secondary | ICD-10-CM | POA: Diagnosis not present

## 2022-06-21 DIAGNOSIS — E7849 Other hyperlipidemia: Secondary | ICD-10-CM | POA: Diagnosis not present

## 2022-06-21 DIAGNOSIS — Z1211 Encounter for screening for malignant neoplasm of colon: Secondary | ICD-10-CM | POA: Diagnosis not present

## 2022-06-21 DIAGNOSIS — R739 Hyperglycemia, unspecified: Secondary | ICD-10-CM | POA: Diagnosis not present

## 2022-06-21 DIAGNOSIS — Z125 Encounter for screening for malignant neoplasm of prostate: Secondary | ICD-10-CM | POA: Diagnosis not present

## 2022-06-21 DIAGNOSIS — G4733 Obstructive sleep apnea (adult) (pediatric): Secondary | ICD-10-CM | POA: Diagnosis not present

## 2022-08-12 ENCOUNTER — Other Ambulatory Visit (HOSPITAL_COMMUNITY): Payer: Self-pay

## 2022-08-12 MED ORDER — ESCITALOPRAM OXALATE 10 MG PO TABS
10.0000 mg | ORAL_TABLET | Freq: Every day | ORAL | 3 refills | Status: DC
  Filled 2022-08-12: qty 30, 30d supply, fill #0
  Filled 2022-10-02: qty 30, 30d supply, fill #1
  Filled 2022-11-26: qty 30, 30d supply, fill #2

## 2022-08-13 ENCOUNTER — Other Ambulatory Visit (HOSPITAL_COMMUNITY): Payer: Self-pay

## 2022-08-13 DIAGNOSIS — Z79899 Other long term (current) drug therapy: Secondary | ICD-10-CM | POA: Diagnosis not present

## 2022-08-13 DIAGNOSIS — R41840 Attention and concentration deficit: Secondary | ICD-10-CM | POA: Diagnosis not present

## 2022-08-13 MED ORDER — METHYLPHENIDATE HCL ER (CD) 20 MG PO CPCR
20.0000 mg | ORAL_CAPSULE | Freq: Every day | ORAL | 0 refills | Status: AC
  Filled 2022-08-13: qty 30, 30d supply, fill #0

## 2022-08-16 ENCOUNTER — Other Ambulatory Visit (HOSPITAL_COMMUNITY): Payer: Self-pay

## 2022-09-02 DIAGNOSIS — G4733 Obstructive sleep apnea (adult) (pediatric): Secondary | ICD-10-CM | POA: Diagnosis not present

## 2022-09-02 DIAGNOSIS — R7303 Prediabetes: Secondary | ICD-10-CM | POA: Diagnosis not present

## 2022-09-02 DIAGNOSIS — F4323 Adjustment disorder with mixed anxiety and depressed mood: Secondary | ICD-10-CM | POA: Diagnosis not present

## 2022-09-02 DIAGNOSIS — G4709 Other insomnia: Secondary | ICD-10-CM | POA: Diagnosis not present

## 2022-09-02 DIAGNOSIS — E7849 Other hyperlipidemia: Secondary | ICD-10-CM | POA: Diagnosis not present

## 2022-09-17 ENCOUNTER — Other Ambulatory Visit (HOSPITAL_COMMUNITY): Payer: Self-pay

## 2022-09-18 ENCOUNTER — Other Ambulatory Visit (HOSPITAL_COMMUNITY): Payer: Self-pay

## 2022-09-19 ENCOUNTER — Other Ambulatory Visit (HOSPITAL_COMMUNITY): Payer: Self-pay

## 2022-09-20 ENCOUNTER — Other Ambulatory Visit (HOSPITAL_COMMUNITY): Payer: Self-pay

## 2022-09-25 DIAGNOSIS — F902 Attention-deficit hyperactivity disorder, combined type: Secondary | ICD-10-CM | POA: Diagnosis not present

## 2022-09-25 DIAGNOSIS — Z79899 Other long term (current) drug therapy: Secondary | ICD-10-CM | POA: Diagnosis not present

## 2022-11-11 ENCOUNTER — Other Ambulatory Visit (HOSPITAL_COMMUNITY): Payer: Self-pay

## 2022-11-11 MED ORDER — OMEPRAZOLE 40 MG PO CPDR
40.0000 mg | DELAYED_RELEASE_CAPSULE | Freq: Every day | ORAL | 1 refills | Status: DC
  Filled 2022-11-11: qty 90, 90d supply, fill #0
  Filled 2023-02-10: qty 60, 60d supply, fill #1

## 2022-11-12 ENCOUNTER — Other Ambulatory Visit (HOSPITAL_COMMUNITY): Payer: Self-pay

## 2022-11-15 ENCOUNTER — Other Ambulatory Visit (HOSPITAL_COMMUNITY): Payer: Self-pay

## 2022-11-18 ENCOUNTER — Other Ambulatory Visit (HOSPITAL_COMMUNITY): Payer: Self-pay

## 2022-11-18 ENCOUNTER — Other Ambulatory Visit (HOSPITAL_BASED_OUTPATIENT_CLINIC_OR_DEPARTMENT_OTHER): Payer: Self-pay

## 2022-11-18 MED ORDER — FENOFIBRATE 160 MG PO TABS
160.0000 mg | ORAL_TABLET | Freq: Every day | ORAL | 1 refills | Status: AC
  Filled 2022-11-18: qty 90, 90d supply, fill #0
  Filled 2023-02-28: qty 90, 90d supply, fill #1

## 2022-12-02 ENCOUNTER — Other Ambulatory Visit (HOSPITAL_COMMUNITY): Payer: Self-pay

## 2022-12-03 ENCOUNTER — Other Ambulatory Visit (HOSPITAL_COMMUNITY): Payer: Self-pay

## 2022-12-04 ENCOUNTER — Other Ambulatory Visit (HOSPITAL_COMMUNITY): Payer: Self-pay

## 2022-12-04 ENCOUNTER — Other Ambulatory Visit: Payer: Self-pay

## 2022-12-04 DIAGNOSIS — E7849 Other hyperlipidemia: Secondary | ICD-10-CM | POA: Diagnosis not present

## 2022-12-04 DIAGNOSIS — R7303 Prediabetes: Secondary | ICD-10-CM | POA: Diagnosis not present

## 2022-12-04 MED ORDER — AMPHETAMINE-DEXTROAMPHETAMINE 20 MG PO TABS
20.0000 mg | ORAL_TABLET | Freq: Every morning | ORAL | 0 refills | Status: DC
  Filled 2022-12-04: qty 30, 30d supply, fill #0

## 2022-12-05 ENCOUNTER — Other Ambulatory Visit: Payer: Self-pay

## 2022-12-06 DIAGNOSIS — E7849 Other hyperlipidemia: Secondary | ICD-10-CM | POA: Diagnosis not present

## 2022-12-06 DIAGNOSIS — F4323 Adjustment disorder with mixed anxiety and depressed mood: Secondary | ICD-10-CM | POA: Diagnosis not present

## 2022-12-06 DIAGNOSIS — R7303 Prediabetes: Secondary | ICD-10-CM | POA: Diagnosis not present

## 2022-12-06 DIAGNOSIS — R5383 Other fatigue: Secondary | ICD-10-CM | POA: Diagnosis not present

## 2022-12-06 DIAGNOSIS — R1013 Epigastric pain: Secondary | ICD-10-CM | POA: Diagnosis not present

## 2022-12-06 DIAGNOSIS — Z125 Encounter for screening for malignant neoplasm of prostate: Secondary | ICD-10-CM | POA: Diagnosis not present

## 2022-12-06 DIAGNOSIS — G4733 Obstructive sleep apnea (adult) (pediatric): Secondary | ICD-10-CM | POA: Diagnosis not present

## 2022-12-10 ENCOUNTER — Other Ambulatory Visit (HOSPITAL_COMMUNITY): Payer: Self-pay

## 2022-12-10 ENCOUNTER — Other Ambulatory Visit: Payer: Self-pay

## 2022-12-10 DIAGNOSIS — J40 Bronchitis, not specified as acute or chronic: Secondary | ICD-10-CM | POA: Diagnosis not present

## 2022-12-10 DIAGNOSIS — R6889 Other general symptoms and signs: Secondary | ICD-10-CM | POA: Diagnosis not present

## 2022-12-10 DIAGNOSIS — R0981 Nasal congestion: Secondary | ICD-10-CM | POA: Diagnosis not present

## 2022-12-10 DIAGNOSIS — R051 Acute cough: Secondary | ICD-10-CM | POA: Diagnosis not present

## 2022-12-10 DIAGNOSIS — Z20828 Contact with and (suspected) exposure to other viral communicable diseases: Secondary | ICD-10-CM | POA: Diagnosis not present

## 2022-12-10 MED ORDER — PREDNISONE 10 MG PO TABS
ORAL_TABLET | ORAL | 0 refills | Status: DC
  Filled 2022-12-10: qty 12, 6d supply, fill #0

## 2022-12-10 MED ORDER — PREDNISONE 10 MG PO TABS
ORAL_TABLET | ORAL | 0 refills | Status: AC
  Filled 2022-12-10: qty 12, 6d supply, fill #0

## 2022-12-10 MED ORDER — ALBUTEROL SULFATE HFA 108 (90 BASE) MCG/ACT IN AERS
2.0000 | INHALATION_SPRAY | Freq: Four times a day (QID) | RESPIRATORY_TRACT | 1 refills | Status: AC | PRN
  Filled 2022-12-10: qty 6.7, 25d supply, fill #0

## 2022-12-10 MED ORDER — ALBUTEROL SULFATE HFA 108 (90 BASE) MCG/ACT IN AERS
2.0000 | INHALATION_SPRAY | Freq: Four times a day (QID) | RESPIRATORY_TRACT | 1 refills | Status: DC | PRN
  Filled 2022-12-10: qty 6.7, 30d supply, fill #0

## 2022-12-10 MED ORDER — AZITHROMYCIN 250 MG PO TABS
ORAL_TABLET | ORAL | 0 refills | Status: AC
  Filled 2022-12-10: qty 6, 5d supply, fill #0

## 2022-12-16 ENCOUNTER — Other Ambulatory Visit (HOSPITAL_COMMUNITY): Payer: Self-pay

## 2023-01-07 ENCOUNTER — Other Ambulatory Visit (HOSPITAL_COMMUNITY): Payer: Self-pay

## 2023-01-07 ENCOUNTER — Other Ambulatory Visit: Payer: Self-pay

## 2023-01-09 ENCOUNTER — Other Ambulatory Visit (HOSPITAL_COMMUNITY): Payer: Self-pay

## 2023-01-09 ENCOUNTER — Other Ambulatory Visit: Payer: Self-pay

## 2023-01-09 MED ORDER — AMPHETAMINE-DEXTROAMPHETAMINE 20 MG PO TABS
20.0000 mg | ORAL_TABLET | Freq: Every morning | ORAL | 0 refills | Status: DC
  Filled 2023-01-09: qty 30, 30d supply, fill #0

## 2023-01-10 ENCOUNTER — Other Ambulatory Visit: Payer: Self-pay

## 2023-02-10 ENCOUNTER — Other Ambulatory Visit (HOSPITAL_COMMUNITY): Payer: Self-pay

## 2023-02-10 ENCOUNTER — Other Ambulatory Visit: Payer: Self-pay

## 2023-02-11 ENCOUNTER — Other Ambulatory Visit (HOSPITAL_COMMUNITY): Payer: Self-pay

## 2023-02-11 MED ORDER — AMPHETAMINE-DEXTROAMPHETAMINE 20 MG PO TABS
20.0000 mg | ORAL_TABLET | Freq: Every day | ORAL | 0 refills | Status: AC
  Filled 2023-02-11: qty 30, 30d supply, fill #0
  Filled 2023-02-11: qty 60, 60d supply, fill #0

## 2023-02-12 ENCOUNTER — Other Ambulatory Visit (HOSPITAL_COMMUNITY): Payer: Self-pay

## 2023-03-11 ENCOUNTER — Other Ambulatory Visit (HOSPITAL_COMMUNITY): Payer: Self-pay

## 2023-03-11 DIAGNOSIS — R1013 Epigastric pain: Secondary | ICD-10-CM | POA: Diagnosis not present

## 2023-03-11 DIAGNOSIS — K219 Gastro-esophageal reflux disease without esophagitis: Secondary | ICD-10-CM | POA: Diagnosis not present

## 2023-03-11 DIAGNOSIS — Z1211 Encounter for screening for malignant neoplasm of colon: Secondary | ICD-10-CM | POA: Diagnosis not present

## 2023-03-11 MED ORDER — NA SULFATE-K SULFATE-MG SULF 17.5-3.13-1.6 GM/177ML PO SOLN
ORAL | 0 refills | Status: DC
Start: 2023-03-11 — End: 2023-08-18
  Filled 2023-03-11: qty 354, 2d supply, fill #0
  Filled 2023-04-21: qty 354, 1d supply, fill #0

## 2023-03-21 ENCOUNTER — Other Ambulatory Visit (HOSPITAL_COMMUNITY): Payer: Self-pay

## 2023-03-24 ENCOUNTER — Other Ambulatory Visit (HOSPITAL_COMMUNITY): Payer: Self-pay

## 2023-03-24 DIAGNOSIS — R0982 Postnasal drip: Secondary | ICD-10-CM | POA: Diagnosis not present

## 2023-03-24 DIAGNOSIS — R051 Acute cough: Secondary | ICD-10-CM | POA: Diagnosis not present

## 2023-03-24 DIAGNOSIS — R5381 Other malaise: Secondary | ICD-10-CM | POA: Diagnosis not present

## 2023-03-24 DIAGNOSIS — J101 Influenza due to other identified influenza virus with other respiratory manifestations: Secondary | ICD-10-CM | POA: Diagnosis not present

## 2023-03-24 MED ORDER — OSELTAMIVIR PHOSPHATE 75 MG PO CAPS
75.0000 mg | ORAL_CAPSULE | Freq: Two times a day (BID) | ORAL | 0 refills | Status: AC
  Filled 2023-03-24: qty 10, 5d supply, fill #0

## 2023-04-09 ENCOUNTER — Other Ambulatory Visit (HOSPITAL_COMMUNITY): Payer: Self-pay

## 2023-04-09 MED ORDER — ESCITALOPRAM OXALATE 10 MG PO TABS
10.0000 mg | ORAL_TABLET | Freq: Every day | ORAL | 1 refills | Status: DC
  Filled 2023-04-09: qty 90, 90d supply, fill #0

## 2023-04-15 ENCOUNTER — Encounter: Payer: Self-pay | Admitting: Gastroenterology

## 2023-04-18 ENCOUNTER — Other Ambulatory Visit (HOSPITAL_COMMUNITY): Payer: Self-pay

## 2023-04-18 MED ORDER — OMEPRAZOLE 40 MG PO CPDR
40.0000 mg | DELAYED_RELEASE_CAPSULE | Freq: Every day | ORAL | 1 refills | Status: DC
  Filled 2023-04-18: qty 90, 90d supply, fill #0
  Filled 2023-07-24: qty 90, 90d supply, fill #1

## 2023-04-21 ENCOUNTER — Other Ambulatory Visit (HOSPITAL_COMMUNITY): Payer: Self-pay

## 2023-04-22 ENCOUNTER — Other Ambulatory Visit: Payer: Self-pay

## 2023-04-25 ENCOUNTER — Ambulatory Visit: Admitting: Anesthesiology

## 2023-04-25 ENCOUNTER — Ambulatory Visit
Admission: RE | Admit: 2023-04-25 | Discharge: 2023-04-25 | Disposition: A | Discharge status: Home / Self Care | Attending: Gastroenterology | Admitting: Gastroenterology

## 2023-04-25 ENCOUNTER — Other Ambulatory Visit: Payer: Self-pay

## 2023-04-25 ENCOUNTER — Encounter: Payer: Self-pay | Admitting: Gastroenterology

## 2023-04-25 ENCOUNTER — Encounter
Admission: RE | Disposition: A | Discharge status: Home / Self Care | Payer: Self-pay | Source: Home / Self Care | Attending: Gastroenterology

## 2023-04-25 DIAGNOSIS — Z1211 Encounter for screening for malignant neoplasm of colon: Secondary | ICD-10-CM | POA: Diagnosis not present

## 2023-04-25 DIAGNOSIS — D120 Benign neoplasm of cecum: Secondary | ICD-10-CM | POA: Insufficient documentation

## 2023-04-25 DIAGNOSIS — D128 Benign neoplasm of rectum: Secondary | ICD-10-CM | POA: Insufficient documentation

## 2023-04-25 DIAGNOSIS — K640 First degree hemorrhoids: Secondary | ICD-10-CM | POA: Insufficient documentation

## 2023-04-25 DIAGNOSIS — D122 Benign neoplasm of ascending colon: Secondary | ICD-10-CM | POA: Insufficient documentation

## 2023-04-25 DIAGNOSIS — K529 Noninfective gastroenteritis and colitis, unspecified: Secondary | ICD-10-CM | POA: Insufficient documentation

## 2023-04-25 DIAGNOSIS — K635 Polyp of colon: Secondary | ICD-10-CM | POA: Diagnosis not present

## 2023-04-25 DIAGNOSIS — G4730 Sleep apnea, unspecified: Secondary | ICD-10-CM | POA: Diagnosis not present

## 2023-04-25 DIAGNOSIS — K5989 Other specified functional intestinal disorders: Secondary | ICD-10-CM | POA: Diagnosis not present

## 2023-04-25 DIAGNOSIS — K6389 Other specified diseases of intestine: Secondary | ICD-10-CM | POA: Diagnosis not present

## 2023-04-25 DIAGNOSIS — K5730 Diverticulosis of large intestine without perforation or abscess without bleeding: Secondary | ICD-10-CM | POA: Insufficient documentation

## 2023-04-25 DIAGNOSIS — F4323 Adjustment disorder with mixed anxiety and depressed mood: Secondary | ICD-10-CM | POA: Diagnosis not present

## 2023-04-25 HISTORY — DX: Sleep apnea, unspecified: G47.30

## 2023-04-25 HISTORY — PX: COLONOSCOPY: SHX5424

## 2023-04-25 SURGERY — COLONOSCOPY
Anesthesia: General

## 2023-04-25 MED ORDER — PROPOFOL 1000 MG/100ML IV EMUL
INTRAVENOUS | Status: AC
  Filled 2023-04-25: qty 100

## 2023-04-25 MED ORDER — PROPOFOL 10 MG/ML IV BOLUS
INTRAVENOUS | Status: DC | PRN
  Administered 2023-04-25: 20 mg via INTRAVENOUS
  Administered 2023-04-25: 80 mg via INTRAVENOUS

## 2023-04-25 MED ORDER — SODIUM CHLORIDE 0.9 % IV SOLN: INTRAVENOUS | Status: DC

## 2023-04-25 MED ORDER — PHENYLEPHRINE 80 MCG/ML (10ML) SYRINGE FOR IV PUSH (FOR BLOOD PRESSURE SUPPORT): PREFILLED_SYRINGE | INTRAVENOUS | Status: DC | PRN

## 2023-04-25 MED ORDER — PROPOFOL 500 MG/50ML IV EMUL
INTRAVENOUS | Status: DC | PRN
  Administered 2023-04-25: 100 ug/kg/min via INTRAVENOUS

## 2023-04-25 NOTE — Anesthesia Preprocedure Evaluation (Addendum)
 Anesthesia Evaluation  Patient identified by MRN, date of birth, ID band Patient awake    Reviewed: Allergy & Precautions, H&P , NPO status , Patient's Chart, lab work & pertinent test results, reviewed documented beta blocker date and time   History of Anesthesia Complications Negative for: history of anesthetic complications  Airway Mallampati: II  TM Distance: >3 FB Neck ROM: full    Dental  (+) Dental Advidsory Given, Caps, Missing   Pulmonary neg shortness of breath, sleep apnea and Continuous Positive Airway Pressure Ventilation , neg COPD, neg recent URI   Pulmonary exam normal breath sounds clear to auscultation       Cardiovascular Exercise Tolerance: Good negative cardio ROS Normal cardiovascular exam Rhythm:regular Rate:Normal     Neuro/Psych  PSYCHIATRIC DISORDERS (adjustment disorder with mixed anxiety and depressed mood)      negative neurological ROS     GI/Hepatic Neg liver ROS,GERD  ,,  Endo/Other  negative endocrine ROS  Prediabetes   Renal/GU negative Renal ROS  negative genitourinary   Musculoskeletal   Abdominal   Peds  Hematology negative hematology ROS (+)   Anesthesia Other Findings Past Medical History: No date: Reflux No date: Sleep apnea   Reproductive/Obstetrics negative OB ROS                             Anesthesia Physical Anesthesia Plan  ASA: 2  Anesthesia Plan: General   Post-op Pain Management:    Induction: Intravenous  PONV Risk Score and Plan: 2 and Propofol infusion, TIVA and Treatment may vary due to age or medical condition  Airway Management Planned: Natural Airway and Nasal Cannula  Additional Equipment:   Intra-op Plan:   Post-operative Plan:   Informed Consent: I have reviewed the patients History and Physical, chart, labs and discussed the procedure including the risks, benefits and alternatives for the proposed anesthesia with  the patient or authorized representative who has indicated his/her understanding and acceptance.     Dental Advisory Given  Plan Discussed with: Anesthesiologist, CRNA and Surgeon  Anesthesia Plan Comments:         Anesthesia Quick Evaluation

## 2023-04-25 NOTE — Interval H&P Note (Signed)
 History and Physical Interval Note: Preprocedure H&P from 04/25/23  was reviewed and there was no interval change after seeing and examining the patient.  Written consent was obtained from the patient after discussion of risks, benefits, and alternatives. Patient has consented to proceed with Colonoscopy with possible intervention   04/25/2023 1:15 PM  John Pacheco  has presented today for surgery, with the diagnosis of Colon cancer screening (Z12.11).  The various methods of treatment have been discussed with the patient and family. After consideration of risks, benefits and other options for treatment, the patient has consented to  Procedure(s): COLONOSCOPY (N/A) as a surgical intervention.  The patient's history has been reviewed, patient examined, no change in status, stable for surgery.  I have reviewed the patient's chart and labs.  Questions were answered to the patient's satisfaction.     Jaynie Collins

## 2023-04-25 NOTE — H&P (Signed)
 Pre-Procedure H&P   Patient ID: John Pacheco is a 53 y.o. male.  Gastroenterology Provider: Jaynie Collins, DO  Referring Provider: Dr. Graciela Husbands PCP: Lynnea Ferrier, MD  Date: 04/25/2023  HPI Mr. John Pacheco is a 53 y.o. male who presents today for Colonoscopy for Colorectal cancer screening .  Initial screening.  No family history of colon cancer or colon polyps.  He reports daily bowel meant without melena or hematochezia  Hemoglobin 16 MCV 92 platelets 295,000   Past Medical History:  Diagnosis Date   Reflux    Sleep apnea     Past Surgical History:  Procedure Laterality Date   ELBOW SURGERY     EYE SURGERY     feet surgery     flat feet   FRACTURE SURGERY     HERNIA REPAIR     right leg fracture s/p repair      Family History No h/o GI disease or malignancy  Review of Systems  Constitutional:  Negative for activity change, appetite change, chills, diaphoresis, fatigue, fever and unexpected weight change.  HENT:  Negative for trouble swallowing and voice change.   Respiratory:  Negative for shortness of breath and wheezing.   Cardiovascular:  Negative for chest pain, palpitations and leg swelling.  Gastrointestinal:  Negative for abdominal distention, abdominal pain, anal bleeding, blood in stool, constipation, diarrhea, nausea and vomiting.  Musculoskeletal:  Negative for arthralgias and myalgias.  Skin:  Negative for color change and pallor.  Neurological:  Negative for dizziness, syncope and weakness.  Psychiatric/Behavioral:  Negative for confusion. The patient is not nervous/anxious.   All other systems reviewed and are negative.    Medications No current facility-administered medications on file prior to encounter.   Current Outpatient Medications on File Prior to Encounter  Medication Sig Dispense Refill   cholecalciferol (VITAMIN D) 1000 units tablet Take 2,000 Units by mouth daily.     albuterol (VENTOLIN HFA) 108 (90 Base) MCG/ACT inhaler  Inhale 2 puffs into the lungs every 6 (six) hours as needed for wheezing. 6.7 g 1   albuterol (VENTOLIN HFA) 108 (90 Base) MCG/ACT inhaler Inhale 2 puffs into the lungs every 6 (six) hours as needed for Wheezing 6.7 g 1   amphetamine-dextroamphetamine (ADDERALL) 20 MG tablet Take 1 tablet (20 mg total) by mouth daily. 90 tablet 0   azithromycin (ZITHROMAX) 250 MG tablet Take 2 tablets (500mg ) by mouth on Day 1. Take 1 tablet (250mg ) by mouth on Days 2-5. 6 tablet 0   escitalopram (LEXAPRO) 10 MG tablet Take 10 mg by mouth at bedtime.     fenofibrate 160 MG tablet Take 160 mg by mouth daily.     fenofibrate 160 MG tablet Take 1 tablet (160 mg total) by mouth daily. 90 tablet 1   ibuprofen (ADVIL,MOTRIN) 200 MG tablet Take 400 mg by mouth every 6 (six) hours as needed for headache (or pain).     methylphenidate (METADATE CD) 20 MG CR capsule Take 1 capsule (20 mg total) by mouth daily. 30 capsule 0   Na Sulfate-K Sulfate-Mg Sulfate concentrate (SUPREP) 17.5-3.13-1.6 GM/177ML SOLN Take both bottles at the times instructed by your provider. 354 mL 0   omeprazole (PRILOSEC) 40 MG capsule Take 40 mg by mouth daily.     predniSONE (DELTASONE) 10 MG tablet Take 3 tabs daily for 2 days, then 2 tabs daily for 2 days, then 1 tab daily for 2 days 12 tablet 0   Propylene Glycol (  SYSTANE BALANCE) 0.6 % SOLN Place 1-2 drops into both eyes daily as needed (for itching).     traZODone (DESYREL) 50 MG tablet Take 1 tablet (50 mg total) by mouth at bedtime as needed for sleep. 14 tablet 0    Pertinent medications related to GI and procedure were reviewed by me with the patient prior to the procedure   Current Facility-Administered Medications:    0.9 %  sodium chloride infusion, , Intravenous, Continuous, Jaynie Collins, DO, Last Rate: 20 mL/hr at 04/25/23 1244, New Bag at 04/25/23 1244  sodium chloride 20 mL/hr at 04/25/23 1244       No Known Allergies Allergies were reviewed by me prior to the  procedure  Objective   Body mass index is 26.03 kg/m. Vitals:   04/25/23 1245  BP: (!) 132/31  Pulse: 68  Resp: 20  Temp: (!) 96.9 F (36.1 C)  TempSrc: Tympanic  SpO2: 100%  Weight: 77.7 kg  Height: 5\' 8"  (1.727 m)     Physical Exam Vitals and nursing note reviewed.  Constitutional:      General: He is not in acute distress.    Appearance: Normal appearance. He is not ill-appearing, toxic-appearing or diaphoretic.  HENT:     Head: Normocephalic and atraumatic.     Nose: Nose normal.     Mouth/Throat:     Mouth: Mucous membranes are moist.     Pharynx: Oropharynx is clear.  Eyes:     General: No scleral icterus.    Extraocular Movements: Extraocular movements intact.  Cardiovascular:     Rate and Rhythm: Normal rate and regular rhythm.     Heart sounds: Normal heart sounds. No murmur heard.    No friction rub. No gallop.  Pulmonary:     Effort: Pulmonary effort is normal. No respiratory distress.     Breath sounds: Normal breath sounds. No wheezing, rhonchi or rales.  Abdominal:     General: Bowel sounds are normal. There is no distension.     Palpations: Abdomen is soft.     Tenderness: There is no abdominal tenderness. There is no guarding or rebound.  Musculoskeletal:     Cervical back: Neck supple.     Right lower leg: No edema.     Left lower leg: No edema.  Skin:    General: Skin is warm and dry.     Coloration: Skin is not jaundiced or pale.  Neurological:     General: No focal deficit present.     Mental Status: He is alert and oriented to person, place, and time. Mental status is at baseline.  Psychiatric:        Mood and Affect: Mood normal.        Behavior: Behavior normal.        Thought Content: Thought content normal.        Judgment: Judgment normal.      Assessment:  Mr. John Pacheco is a 53 y.o. male  who presents today for Colonoscopy for Colorectal cancer screening .  Plan:  Colonoscopy with possible intervention  today  Colonoscopy with possible biopsy, control of bleeding, polypectomy, and interventions as necessary has been discussed with the patient/patient representative. Informed consent was obtained from the patient/patient representative after explaining the indication, nature, and risks of the procedure including but not limited to death, bleeding, perforation, missed neoplasm/lesions, cardiorespiratory compromise, and reaction to medications. Opportunity for questions was given and appropriate answers were provided. Patient/patient representative has verbalized understanding  is amenable to undergoing the procedure.   Jaynie Collins, DO  [facility]  Portions of the record may have been created with voice recognition software. Occasional wrong-word or 'sound-a-like' substitutions may have occurred due to the inherent limitations of voice recognition software.  Read the chart carefully and recognize, using context, where substitutions may have occurred.

## 2023-04-25 NOTE — Anesthesia Postprocedure Evaluation (Signed)
 Anesthesia Post Note  Patient: John Pacheco  Procedure(s) Performed: COLONOSCOPY  Patient location during evaluation: Endoscopy Anesthesia Type: General Level of consciousness: awake and alert Pain management: pain level controlled Vital Signs Assessment: post-procedure vital signs reviewed and stable Respiratory status: spontaneous breathing, nonlabored ventilation, respiratory function stable and patient connected to nasal cannula oxygen Cardiovascular status: blood pressure returned to baseline and stable Postop Assessment: no apparent nausea or vomiting Anesthetic complications: no   No notable events documented.   Last Vitals:  Vitals:   04/25/23 1418 04/25/23 1425  BP: 111/76 122/70  Pulse: (!) 59 (!) 58  Resp: 18 14  Temp:    SpO2: 98%     Last Pain:  Vitals:   04/25/23 1418  TempSrc:   PainSc: 0-No pain                 Lenard Simmer

## 2023-04-25 NOTE — Op Note (Signed)
 [facility] Patient Name: John Pacheco Procedure Date: 04/25/2023 1:25 PM MRN: [id number] Account #: [id number] Date of Birth: 1970/09/17 Admit Type: Outpatient Age: 54 Room: [facility] ENDO ROOM 1 Gender: Male Note Status: Finalized Instrument Name: Colonoscope [id number] Procedure:             Colonoscopy Indications:           Screening for colorectal malignant neoplasm Providers:             Trenda Moots, DO Referring MD:          Daniel Nones, MD (Referring MD) Medicines:             Monitored Anesthesia Care Complications:         No immediate complications. Estimated blood loss:                         Minimal. Procedure:             Pre-Anesthesia Assessment:                        - Prior to the procedure, a History and Physical was                         performed, and patient medications and allergies were                         reviewed. The patient is competent. The risks and                         benefits of the procedure and the sedation options and                         risks were discussed with the patient. All questions                         were answered and informed consent was obtained.                         Patient identification and proposed procedure were                         verified by the physician, the nurse, the anesthetist                         and the technician in the endoscopy suite. Mental                         Status Examination: alert and oriented. Airway                         Examination: normal oropharyngeal airway and neck                         mobility. Respiratory Examination: clear to                         auscultation. CV Examination: RRR, no murmurs, no S3  or S4. Prophylactic Antibiotics: The patient does not                         require prophylactic antibiotics. Prior                         Anticoagulants: The patient has taken no anticoagulant                          or antiplatelet agents. ASA Grade Assessment: II - A                         patient with mild systemic disease. After reviewing                         the risks and benefits, the patient was deemed in                         satisfactory condition to undergo the procedure. The                         anesthesia plan was to use monitored anesthesia care                         (MAC). Immediately prior to administration of                         medications, the patient was re-assessed for adequacy                         to receive sedatives. The heart rate, respiratory                         rate, oxygen saturations, blood pressure, adequacy of                         pulmonary ventilation, and response to care were                         monitored throughout the procedure. The physical                         status of the patient was re-assessed after the                         procedure.                        After obtaining informed consent, the colonoscope was                         passed under direct vision. Throughout the procedure,                         the patient's blood pressure, pulse, and oxygen                         saturations were monitored continuously. The  Colonoscope was introduced through the anus and                         advanced to the the terminal ileum, with                         identification of the appendiceal orifice and IC                         valve. The colonoscopy was performed without                         difficulty. The patient tolerated the procedure well.                         The quality of the bowel preparation was evaluated                         using the BBPS [facility] Bowel Preparation Scale) with                         scores of: Right Colon = 3, Transverse Colon = 3 and                         Left Colon = 3 (entire mucosa seen well with no                         residual staining,  small fragments of stool or opaque                         liquid). The total BBPS score equals 9. The terminal                         ileum, ileocecal valve, appendiceal orifice, and                         rectum were photographed. Findings:      The perianal and digital rectal examinations were normal. Pertinent       negatives include normal sphincter tone.      Localized mild inflammation characterized by erosions was found in the       terminal ileum. Biopsies were taken with a cold forceps for histology.       Estimated blood loss was minimal.      The remainder of the exam in the terminal ileum was normal.      Retroflexion in the right colon was performed.      Non-bleeding internal hemorrhoids were found during retroflexion. The       hemorrhoids were Grade I (internal hemorrhoids that do not prolapse).       Estimated blood loss: none.      Three sessile polyps were found in the ascending colon and cecum. The       polyps were 1 to 2 mm in size. These polyps were removed with a jumbo       cold forceps. Resection and retrieval were complete. Estimated blood       loss was minimal.      A 5 to 6 mm polyp was found in the rectum.  The polyp was sessile. The       polyp was removed with a cold snare. Resection and retrieval were       complete. Estimated blood loss was minimal.      A single small-mouthed diverticulum was found in the proximal ascending       colon. Estimated blood loss: none.      The exam was otherwise without abnormality on direct and retroflexion       views. Impression:            - Mild inflammation was found in the ileum secondary                         to ileitis. Biopsied.                        - Non-bleeding internal hemorrhoids.                        - Three 1 to 2 mm polyps in the ascending colon and in                         the cecum, removed with a jumbo cold forceps. Resected                         and retrieved.                        -  One 5 to 6 mm polyp in the rectum, removed with a                         cold snare. Resected and retrieved.                        - Diverticulosis in the proximal ascending colon.                        - The examination was otherwise normal on direct and                         retroflexion views. Recommendation:        - Patient has a contact number available for                         emergencies. The signs and symptoms of potential                         delayed complications were discussed with the patient.                         Return to normal activities tomorrow. Written                         discharge instructions were provided to the patient.                        - Discharge patient to home.                        - Resume previous diet.                        -  Continue present medications.                        - No ibuprofen, naproxen, or other non-steroidal                         anti-inflammatory drugs for 5 days after polyp removal.                        - Await pathology results.                        - Repeat colonoscopy for surveillance based on                         pathology results.                        - Return to referring physician as previously                         scheduled.                        - The findings and recommendations were discussed with                         the patient. Procedure Code(s):     --- Professional ---                        [phone], Colonoscopy, flexible; with removal of                         tumor(s), polyp(s), or other lesion(s) by snare                         technique                        45380, 59, Colonoscopy, flexible; with biopsy, single                         or multiple Diagnosis Code(s):     --- Professional ---                        Z12.11, Encounter for screening for malignant neoplasm                         of colon                        K52.9, Noninfective gastroenteritis and colitis,                          unspecified                        D12.2, Benign neoplasm of ascending colon                        D12.0, Benign neoplasm of cecum  D12.8, Benign neoplasm of rectum                        K64.0, First degree hemorrhoids                        K57.30, Diverticulosis of large intestine without                         perforation or abscess without bleeding CPT copyright 2022 [employer]. All rights reserved. The codes documented in this report are preliminary and upon coder review may  be revised to meet current compliance requirements. Attending Participation:      I personally performed the entire procedure. Elfredia Nevins, DO Jaynie Collins DO, DO 04/25/2023 1:55:12 PM This report has been signed electronically. Number of Addenda: 0 Note Initiated On: 04/25/2023 1:25 PM Scope Withdrawal Time: 0 hours 12 minutes 57 seconds  Total Procedure Duration: 0 hours 16 minutes 35 seconds  Estimated Blood Loss:  Estimated blood loss was minimal.      [facility]

## 2023-04-25 NOTE — Transfer of Care (Signed)
 Immediate Anesthesia Transfer of Care Note  Patient: John Pacheco  Procedure(s) Performed: COLONOSCOPY  Patient Location: PACU  Anesthesia Type:MAC  Level of Consciousness: drowsy  Airway & Oxygen Therapy: Patient Spontanous Breathing  Post-op Assessment: Report given to RN and Post -op Vital signs reviewed and stable  Post vital signs: Reviewed and stable  Last Vitals:  Vitals Value Taken Time  BP    Temp    Pulse    Resp    SpO2      Last Pain:  Vitals:   04/25/23 1245  TempSrc: Tympanic      Patients Stated Pain Goal: 0 (04/25/23 1245)  Complications: No notable events documented.

## 2023-04-28 ENCOUNTER — Encounter: Payer: Self-pay | Admitting: Gastroenterology

## 2023-04-29 LAB — SURGICAL PATHOLOGY

## 2023-05-13 DIAGNOSIS — R0981 Nasal congestion: Secondary | ICD-10-CM | POA: Diagnosis not present

## 2023-05-13 DIAGNOSIS — J029 Acute pharyngitis, unspecified: Secondary | ICD-10-CM | POA: Diagnosis not present

## 2023-05-13 DIAGNOSIS — R051 Acute cough: Secondary | ICD-10-CM | POA: Diagnosis not present

## 2023-05-13 DIAGNOSIS — J069 Acute upper respiratory infection, unspecified: Secondary | ICD-10-CM | POA: Diagnosis not present

## 2023-05-16 ENCOUNTER — Other Ambulatory Visit (HOSPITAL_COMMUNITY): Payer: Self-pay

## 2023-05-16 MED ORDER — AMPHETAMINE-DEXTROAMPHETAMINE 20 MG PO TABS
20.0000 mg | ORAL_TABLET | Freq: Every day | ORAL | 0 refills | Status: DC
  Filled 2023-05-16: qty 90, 90d supply, fill #0

## 2023-05-16 MED ORDER — DOXYCYCLINE MONOHYDRATE 100 MG PO CAPS
100.0000 mg | ORAL_CAPSULE | Freq: Two times a day (BID) | ORAL | 0 refills | Status: AC
  Filled 2023-05-16: qty 14, 7d supply, fill #0

## 2023-05-19 ENCOUNTER — Other Ambulatory Visit (HOSPITAL_COMMUNITY): Payer: Self-pay

## 2023-06-02 ENCOUNTER — Other Ambulatory Visit (HOSPITAL_COMMUNITY): Payer: Self-pay

## 2023-06-02 MED ORDER — FENOFIBRATE 160 MG PO TABS
160.0000 mg | ORAL_TABLET | Freq: Every day | ORAL | 1 refills | Status: DC
  Filled 2023-06-02: qty 90, 90d supply, fill #0

## 2023-06-04 DIAGNOSIS — R051 Acute cough: Secondary | ICD-10-CM | POA: Diagnosis not present

## 2023-06-04 DIAGNOSIS — J029 Acute pharyngitis, unspecified: Secondary | ICD-10-CM | POA: Diagnosis not present

## 2023-06-04 DIAGNOSIS — R070 Pain in throat: Secondary | ICD-10-CM | POA: Diagnosis not present

## 2023-06-04 DIAGNOSIS — J209 Acute bronchitis, unspecified: Secondary | ICD-10-CM | POA: Diagnosis not present

## 2023-06-06 ENCOUNTER — Other Ambulatory Visit (HOSPITAL_COMMUNITY): Payer: Self-pay

## 2023-06-06 ENCOUNTER — Encounter (HOSPITAL_COMMUNITY): Payer: Self-pay

## 2023-06-18 DIAGNOSIS — G4733 Obstructive sleep apnea (adult) (pediatric): Secondary | ICD-10-CM | POA: Diagnosis not present

## 2023-06-19 ENCOUNTER — Other Ambulatory Visit (HOSPITAL_COMMUNITY): Payer: Self-pay

## 2023-06-26 ENCOUNTER — Other Ambulatory Visit (HOSPITAL_COMMUNITY): Payer: Self-pay

## 2023-06-26 MED ORDER — FENOFIBRATE 160 MG PO TABS
160.0000 mg | ORAL_TABLET | Freq: Every day | ORAL | 1 refills | Status: DC
  Filled 2023-06-26: qty 90, 90d supply, fill #0
  Filled 2023-09-29: qty 90, 90d supply, fill #1

## 2023-07-24 ENCOUNTER — Other Ambulatory Visit (HOSPITAL_COMMUNITY): Payer: Self-pay

## 2023-08-07 ENCOUNTER — Other Ambulatory Visit: Payer: Self-pay

## 2023-08-07 ENCOUNTER — Other Ambulatory Visit (HOSPITAL_COMMUNITY): Payer: Self-pay

## 2023-08-07 DIAGNOSIS — G4733 Obstructive sleep apnea (adult) (pediatric): Secondary | ICD-10-CM | POA: Diagnosis not present

## 2023-08-07 DIAGNOSIS — J309 Allergic rhinitis, unspecified: Secondary | ICD-10-CM | POA: Diagnosis not present

## 2023-08-07 DIAGNOSIS — R0981 Nasal congestion: Secondary | ICD-10-CM | POA: Diagnosis not present

## 2023-08-07 DIAGNOSIS — J342 Deviated nasal septum: Secondary | ICD-10-CM | POA: Diagnosis not present

## 2023-08-07 MED ORDER — AZELASTINE HCL 0.1 % NA SOLN
1.0000 | Freq: Two times a day (BID) | NASAL | 12 refills | Status: AC
  Filled 2023-08-07: qty 30, 50d supply, fill #0

## 2023-08-07 MED ORDER — FLUTICASONE PROPIONATE 50 MCG/ACT NA SUSP
2.0000 | Freq: Every day | NASAL | 12 refills | Status: AC
  Filled 2023-08-07: qty 16, 30d supply, fill #0

## 2023-08-18 ENCOUNTER — Other Ambulatory Visit (HOSPITAL_COMMUNITY): Payer: Self-pay

## 2023-08-18 DIAGNOSIS — R5383 Other fatigue: Secondary | ICD-10-CM | POA: Diagnosis not present

## 2023-08-18 DIAGNOSIS — G4733 Obstructive sleep apnea (adult) (pediatric): Secondary | ICD-10-CM | POA: Diagnosis not present

## 2023-08-18 MED ORDER — AMPHETAMINE-DEXTROAMPHETAMINE 20 MG PO TABS
20.0000 mg | ORAL_TABLET | Freq: Every day | ORAL | 0 refills | Status: DC
  Filled 2023-08-18: qty 90, 90d supply, fill #0

## 2023-08-19 ENCOUNTER — Other Ambulatory Visit (HOSPITAL_COMMUNITY): Payer: Self-pay

## 2023-08-20 ENCOUNTER — Other Ambulatory Visit (HOSPITAL_COMMUNITY): Payer: Self-pay

## 2023-09-07 DIAGNOSIS — R21 Rash and other nonspecific skin eruption: Secondary | ICD-10-CM | POA: Diagnosis not present

## 2023-09-07 DIAGNOSIS — L299 Pruritus, unspecified: Secondary | ICD-10-CM | POA: Diagnosis not present

## 2023-09-12 DIAGNOSIS — M25512 Pain in left shoulder: Secondary | ICD-10-CM | POA: Diagnosis not present

## 2023-09-12 DIAGNOSIS — G8929 Other chronic pain: Secondary | ICD-10-CM | POA: Diagnosis not present

## 2023-09-19 ENCOUNTER — Other Ambulatory Visit (HOSPITAL_COMMUNITY): Payer: Self-pay

## 2023-09-19 MED ORDER — MELOXICAM 15 MG PO TABS
15.0000 mg | ORAL_TABLET | Freq: Every day | ORAL | 1 refills | Status: DC
  Filled 2023-09-19: qty 30, 30d supply, fill #0

## 2023-09-29 ENCOUNTER — Other Ambulatory Visit (HOSPITAL_COMMUNITY): Payer: Self-pay

## 2023-10-28 ENCOUNTER — Other Ambulatory Visit (HOSPITAL_COMMUNITY): Payer: Self-pay

## 2023-10-28 MED ORDER — OMEPRAZOLE 40 MG PO CPDR
40.0000 mg | DELAYED_RELEASE_CAPSULE | Freq: Every day | ORAL | 1 refills | Status: AC
  Filled 2023-10-28: qty 90, 90d supply, fill #0
  Filled 2024-02-02: qty 90, 90d supply, fill #1

## 2023-10-30 DIAGNOSIS — M25512 Pain in left shoulder: Secondary | ICD-10-CM | POA: Diagnosis not present

## 2023-10-30 DIAGNOSIS — G8929 Other chronic pain: Secondary | ICD-10-CM | POA: Diagnosis not present

## 2023-10-30 DIAGNOSIS — M67912 Unspecified disorder of synovium and tendon, left shoulder: Secondary | ICD-10-CM | POA: Diagnosis not present

## 2023-11-20 DIAGNOSIS — R062 Wheezing: Secondary | ICD-10-CM | POA: Diagnosis not present

## 2023-11-20 DIAGNOSIS — R0981 Nasal congestion: Secondary | ICD-10-CM | POA: Diagnosis not present

## 2023-11-20 DIAGNOSIS — J069 Acute upper respiratory infection, unspecified: Secondary | ICD-10-CM | POA: Diagnosis not present

## 2023-11-20 DIAGNOSIS — R051 Acute cough: Secondary | ICD-10-CM | POA: Diagnosis not present

## 2023-11-21 ENCOUNTER — Other Ambulatory Visit (HOSPITAL_COMMUNITY): Payer: Self-pay

## 2023-11-24 ENCOUNTER — Other Ambulatory Visit (HOSPITAL_COMMUNITY): Payer: Self-pay

## 2023-11-24 MED ORDER — AMPHETAMINE-DEXTROAMPHETAMINE 20 MG PO TABS
20.0000 mg | ORAL_TABLET | Freq: Every day | ORAL | 0 refills | Status: DC
  Filled 2023-11-24: qty 90, 90d supply, fill #0

## 2023-11-26 DIAGNOSIS — Z6827 Body mass index (BMI) 27.0-27.9, adult: Secondary | ICD-10-CM | POA: Diagnosis not present

## 2023-11-26 DIAGNOSIS — G4733 Obstructive sleep apnea (adult) (pediatric): Secondary | ICD-10-CM | POA: Diagnosis not present

## 2023-11-26 DIAGNOSIS — Z789 Other specified health status: Secondary | ICD-10-CM | POA: Diagnosis not present

## 2023-11-26 DIAGNOSIS — R0981 Nasal congestion: Secondary | ICD-10-CM | POA: Diagnosis not present

## 2023-11-26 DIAGNOSIS — Z9189 Other specified personal risk factors, not elsewhere classified: Secondary | ICD-10-CM | POA: Diagnosis not present

## 2023-12-15 DIAGNOSIS — G4733 Obstructive sleep apnea (adult) (pediatric): Secondary | ICD-10-CM | POA: Diagnosis not present

## 2023-12-15 DIAGNOSIS — Z789 Other specified health status: Secondary | ICD-10-CM | POA: Diagnosis not present

## 2023-12-15 DIAGNOSIS — J34829 Nasal valve collapse, unspecified: Secondary | ICD-10-CM | POA: Diagnosis not present

## 2023-12-15 DIAGNOSIS — J329 Chronic sinusitis, unspecified: Secondary | ICD-10-CM | POA: Diagnosis not present

## 2023-12-15 DIAGNOSIS — Z6827 Body mass index (BMI) 27.0-27.9, adult: Secondary | ICD-10-CM | POA: Diagnosis not present

## 2023-12-15 DIAGNOSIS — J342 Deviated nasal septum: Secondary | ICD-10-CM | POA: Diagnosis not present

## 2023-12-31 DIAGNOSIS — J329 Chronic sinusitis, unspecified: Secondary | ICD-10-CM | POA: Diagnosis not present

## 2024-01-01 ENCOUNTER — Other Ambulatory Visit (HOSPITAL_COMMUNITY): Payer: Self-pay

## 2024-01-02 ENCOUNTER — Other Ambulatory Visit (HOSPITAL_COMMUNITY): Payer: Self-pay

## 2024-01-02 MED ORDER — FENOFIBRATE 160 MG PO TABS
160.0000 mg | ORAL_TABLET | Freq: Every day | ORAL | 1 refills | Status: DC
  Filled 2024-01-02: qty 90, 90d supply, fill #0

## 2024-01-05 DIAGNOSIS — R058 Other specified cough: Secondary | ICD-10-CM | POA: Diagnosis not present

## 2024-01-05 DIAGNOSIS — R0602 Shortness of breath: Secondary | ICD-10-CM | POA: Diagnosis not present

## 2024-01-20 ENCOUNTER — Other Ambulatory Visit (HOSPITAL_COMMUNITY): Payer: Self-pay

## 2024-01-20 MED ORDER — PREDNISONE 20 MG PO TABS
20.0000 mg | ORAL_TABLET | Freq: Every day | ORAL | 0 refills | Status: AC
  Filled 2024-01-20: qty 5, 5d supply, fill #0

## 2024-01-20 MED ORDER — DOXYCYCLINE HYCLATE 100 MG PO TABS
100.0000 mg | ORAL_TABLET | Freq: Two times a day (BID) | ORAL | 0 refills | Status: AC
  Filled 2024-01-20: qty 14, 7d supply, fill #0

## 2024-02-02 ENCOUNTER — Other Ambulatory Visit (HOSPITAL_COMMUNITY): Payer: Self-pay

## 2024-02-02 MED ORDER — FLUTICASONE PROPIONATE HFA 110 MCG/ACT IN AERO
2.0000 | INHALATION_SPRAY | Freq: Two times a day (BID) | RESPIRATORY_TRACT | 1 refills | Status: AC
  Filled 2024-02-02: qty 12, 30d supply, fill #0

## 2024-02-04 ENCOUNTER — Other Ambulatory Visit (HOSPITAL_COMMUNITY): Payer: Self-pay

## 2024-02-05 ENCOUNTER — Other Ambulatory Visit (HOSPITAL_COMMUNITY): Payer: Self-pay

## 2024-02-05 MED ORDER — FENOFIBRATE 160 MG PO TABS
160.0000 mg | ORAL_TABLET | Freq: Every day | ORAL | 3 refills | Status: AC
  Filled 2024-02-05: qty 90, 90d supply, fill #0

## 2024-02-06 ENCOUNTER — Other Ambulatory Visit: Payer: Self-pay

## 2024-02-06 ENCOUNTER — Other Ambulatory Visit (HOSPITAL_COMMUNITY): Payer: Self-pay

## 2024-02-06 ENCOUNTER — Encounter: Payer: Self-pay | Admitting: Pharmacist

## 2024-02-09 ENCOUNTER — Other Ambulatory Visit: Payer: Self-pay

## 2024-02-18 ENCOUNTER — Other Ambulatory Visit (HOSPITAL_COMMUNITY): Payer: Self-pay

## 2024-02-18 MED ORDER — AMPHETAMINE-DEXTROAMPHET ER 25 MG PO CP24
25.0000 mg | ORAL_CAPSULE | Freq: Every morning | ORAL | 0 refills | Status: AC
  Filled 2024-02-18: qty 30, 30d supply, fill #0
